# Patient Record
Sex: Female | Born: 1990 | Race: Black or African American | Hispanic: No | Marital: Single | Smoking: Former smoker
Health system: Southern US, Community
[De-identification: ages and names within clinical notes are randomized; demographics above are authoritative.]

## PROBLEM LIST (undated history)

## (undated) ENCOUNTER — Inpatient Hospital Stay (HOSPITAL_COMMUNITY): Payer: Self-pay

## (undated) DIAGNOSIS — R51 Headache: Secondary | ICD-10-CM

## (undated) DIAGNOSIS — R519 Headache, unspecified: Secondary | ICD-10-CM

## (undated) DIAGNOSIS — N83209 Unspecified ovarian cyst, unspecified side: Secondary | ICD-10-CM

## (undated) DIAGNOSIS — F419 Anxiety disorder, unspecified: Secondary | ICD-10-CM

## (undated) HISTORY — PX: KELOID EXCISION: SHX1856

---
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
## 2009-08-03 ENCOUNTER — Emergency Department: Payer: Self-pay | Admitting: Emergency Medicine

## 2010-04-07 ENCOUNTER — Emergency Department: Payer: Self-pay | Admitting: Emergency Medicine

## 2010-06-02 ENCOUNTER — Emergency Department: Payer: Self-pay | Admitting: Emergency Medicine

## 2010-08-06 ENCOUNTER — Ambulatory Visit (HOSPITAL_COMMUNITY)
Admission: AD | Admit: 2010-08-06 | Discharge: 2010-08-06 | Payer: Self-pay | Source: Home / Self Care | Attending: Obstetrics and Gynecology | Admitting: Obstetrics and Gynecology

## 2010-08-19 ENCOUNTER — Observation Stay: Payer: Self-pay | Admitting: Obstetrics & Gynecology

## 2010-08-20 ENCOUNTER — Inpatient Hospital Stay (HOSPITAL_COMMUNITY)
Admission: AD | Admit: 2010-08-20 | Discharge: 2010-08-20 | Disposition: A | Discharge status: Home / Self Care | Payer: Commercial Indemnity | Source: Ambulatory Visit | Attending: Obstetrics | Admitting: Obstetrics

## 2010-08-20 DIAGNOSIS — O99891 Other specified diseases and conditions complicating pregnancy: Secondary | ICD-10-CM | POA: Insufficient documentation

## 2010-08-20 DIAGNOSIS — Y92009 Unspecified place in unspecified non-institutional (private) residence as the place of occurrence of the external cause: Secondary | ICD-10-CM | POA: Insufficient documentation

## 2010-08-20 DIAGNOSIS — W010XXA Fall on same level from slipping, tripping and stumbling without subsequent striking against object, initial encounter: Secondary | ICD-10-CM | POA: Insufficient documentation

## 2010-11-07 ENCOUNTER — Observation Stay: Payer: Self-pay

## 2010-11-10 ENCOUNTER — Inpatient Hospital Stay (HOSPITAL_COMMUNITY)
Admission: AD | Admit: 2010-11-10 | Discharge: 2010-11-14 | DRG: 774 | Disposition: A | Discharge status: Home / Self Care | Payer: Managed Care, Other (non HMO) | Source: Ambulatory Visit | Attending: Obstetrics and Gynecology | Admitting: Obstetrics and Gynecology

## 2010-11-10 DIAGNOSIS — O99892 Other specified diseases and conditions complicating childbirth: Secondary | ICD-10-CM | POA: Diagnosis present

## 2010-11-10 DIAGNOSIS — O4100X0 Oligohydramnios, unspecified trimester, not applicable or unspecified: Principal | ICD-10-CM | POA: Diagnosis present

## 2010-11-10 DIAGNOSIS — Z22330 Carrier of Group B streptococcus: Secondary | ICD-10-CM

## 2010-11-10 DIAGNOSIS — O480 Post-term pregnancy: Secondary | ICD-10-CM | POA: Diagnosis present

## 2010-11-10 LAB — CBC
MCH: 27.1 pg (ref 26.0–34.0)
MCHC: 31.9 g/dL (ref 30.0–36.0)
Platelets: 195 10*3/uL (ref 150–400)
RDW: 15.2 % (ref 11.5–15.5)
WBC: 12.1 10*3/uL — ABNORMAL HIGH (ref 4.0–10.5)

## 2010-11-12 ENCOUNTER — Other Ambulatory Visit: Payer: Self-pay | Admitting: Obstetrics and Gynecology

## 2010-11-13 LAB — CBC
HCT: 31.4 % — ABNORMAL LOW (ref 36.0–46.0)
MCV: 86 fL (ref 78.0–100.0)
RBC: 3.65 MIL/uL — ABNORMAL LOW (ref 3.87–5.11)

## 2010-11-14 LAB — RH IMMUNE GLOB WKUP(>/=20WKS)(NOT WOMEN'S HOSP): Fetal Screen: NEGATIVE

## 2010-11-24 NOTE — Op Note (Signed)
[facility]  Patient:    Molly Bowers, Molly Bowers Visit Number: [id number] MRN: [id number]          Service Type: DSU Location: [facility] Attending Physician:  Carlean Purl Dictated by:   Kristine Garbe Ezzard Standing, M.D. Proc. Date: 10/21/01 Admit Date:  10/21/2001   CC:         Teena Irani. Donnie Coffin, M.D.   Operative Report  PREOPERATIVE DIAGNOSIS:  Large right ear preauricular keloid.  POSTOPERATIVE DIAGNOSIS:  Large right ear preauricular keloid.  OPERATION PERFORMED:  Excision of right ear keloid with local advancement flaps.  SURGEON:  Kristine Garbe. Ezzard Standing, M.D.  ANESTHESIA:  General endotracheal.  COMPLICATIONS:  None.  INDICATIONS FOR PROCEDURE:  The patient is an 20 year old black female who has had a recurrent right preauricular keloid.  She has had two previous surgeries.  She has redeveloped a keloid and presently has a large 6 to 7 cm right preauricular keloid that extends from the lobule up over the tragus up onto the upper portion of the right pinna that actually involves some of the cartilage of the right superior pinna.  This is a little bit ulcerated superiorly.  Because of the massive recurrence of the keloid, the patient is taken to the operating room at this time for local excision followed by postoperative radiation therapy to help decrease recurrence of the keloid.  DESCRIPTION OF PROCEDURE:  After adequate endotracheal anesthesia, the patient received 1 gm Ancef IV preoperatively.  The keloid area was injected with Xylocaine with epinephrine for hemostasis.  The keloid was then excised and involved not only the tragus but up on to the cartilage of the superior pinna. The keloid was sent to pathology.  Because of the size of the keloid, the skin over the carotid fascia had to be undermined as was the skin to the temporal area and a little bit behind the ear behind the pinna superiorly.  After adequate undermining,  hemostasis was obtained with bipolar cautery and cotton pledgets soaked in Adrenalin.  Skin defect was then closed by advancing flaps over the cartilage as well as the cheek skin.  Closed with 4-0 Vicryl sutures subcutaneously and a 6-0 nylon on the skin.  Bacitracin ointment was applied, The patient was subsequently awakened from anesthesia and transferred to recovery room postoperatively doing well.  The patient was scheduled to receive radiation therapy later this morning.  Will have her follow up in my office in one week to have the sutures removed.  She is given Tylenol #3 p.r.n. pain, Keflex 500 mg b.i.d. for one week. Dictated by:   Kristine Garbe Ezzard Standing, M.D. Attending Physician:  Carlean Purl DD:  10/21/01 TD:  10/21/01 Job: [id number]

## 2011-04-26 LAB — POCT URINALYSIS DIP (DEVICE)
Glucose, UA: NEGATIVE
Ketones, ur: NEGATIVE
Protein, ur: >=300 — AB
Specific Gravity, Urine: 1.025

## 2011-09-24 DIAGNOSIS — Z4889 Encounter for other specified surgical aftercare: Secondary | ICD-10-CM | POA: Insufficient documentation

## 2011-11-11 ENCOUNTER — Emergency Department: Payer: Self-pay | Admitting: Unknown Physician Specialty

## 2011-11-11 LAB — URINALYSIS, COMPLETE
Nitrite: POSITIVE
Specific Gravity: 1.023 (ref 1.003–1.030)

## 2011-12-02 ENCOUNTER — Emergency Department: Payer: Self-pay | Admitting: Emergency Medicine

## 2013-03-23 ENCOUNTER — Emergency Department (HOSPITAL_COMMUNITY): Payer: Medicaid Other

## 2013-03-23 ENCOUNTER — Encounter (HOSPITAL_COMMUNITY): Payer: Self-pay | Admitting: Emergency Medicine

## 2013-03-23 ENCOUNTER — Emergency Department (HOSPITAL_COMMUNITY)
Admission: EM | Admit: 2013-03-23 | Discharge: 2013-03-23 | Disposition: A | Discharge status: Home / Self Care | Payer: Medicaid Other | Attending: Emergency Medicine | Admitting: Emergency Medicine

## 2013-03-23 DIAGNOSIS — Z880 Allergy status to penicillin: Secondary | ICD-10-CM | POA: Insufficient documentation

## 2013-03-23 DIAGNOSIS — Z79899 Other long term (current) drug therapy: Secondary | ICD-10-CM | POA: Insufficient documentation

## 2013-03-23 DIAGNOSIS — M79672 Pain in left foot: Secondary | ICD-10-CM

## 2013-03-23 DIAGNOSIS — Y929 Unspecified place or not applicable: Secondary | ICD-10-CM | POA: Insufficient documentation

## 2013-03-23 DIAGNOSIS — S8990XA Unspecified injury of unspecified lower leg, initial encounter: Secondary | ICD-10-CM | POA: Insufficient documentation

## 2013-03-23 DIAGNOSIS — Y939 Activity, unspecified: Secondary | ICD-10-CM | POA: Insufficient documentation

## 2013-03-23 DIAGNOSIS — W010XXA Fall on same level from slipping, tripping and stumbling without subsequent striking against object, initial encounter: Secondary | ICD-10-CM | POA: Insufficient documentation

## 2013-03-23 MED ORDER — TRAMADOL HCL 50 MG PO TABS: 50.0000 mg | ORAL_TABLET | Freq: Four times a day (QID) | ORAL | Status: DC | PRN

## 2013-03-23 MED ORDER — NAPROXEN 500 MG PO TABS: 500.0000 mg | ORAL_TABLET | Freq: Two times a day (BID) | ORAL | Status: DC

## 2013-03-23 NOTE — ED Notes (Signed)
Pt states she turned left foot this am while at work. Pain left dorsal foot.

## 2013-03-23 NOTE — ED Provider Notes (Signed)
CSN: [id number]     Arrival date & time 03/23/13  1052 History  This chart was scribed for Molly Bleacher, PA, working with Celene Kras, MD by Blanchard Kelch, ED Scribe. This patient was seen in room TR06C/TR06C and the patient's care was started at 11:56 AM.    Chief Complaint  Patient presents with  . Foot Injury    The history is provided by the patient. No language interpreter was used.    HPI Comments: Molly Bowers is a 22 y.o. female who presents to the Emergency Department complaining of a left foot injury that occurred when the patient fell this morning about five hours ago. She slipped on a puddle of water and landed on her left foot. She has constant, sudden onset pain to the area immediately after the fall. She denies taking any medication for the pain. She denies being able to put weight on her left foot.   History reviewed. No pertinent past medical history. History reviewed. No pertinent past surgical history. No family history on file. History  Substance Use Topics  . Smoking status: Never Smoker   . Smokeless tobacco: Not on file  . Alcohol Use: Yes   OB History   Grav Para Term Preterm Abortions TAB SAB Ect Mult Living                 Review of Systems  Constitutional: Negative for activity change.  HENT: Negative for neck pain.   Musculoskeletal: Positive for arthralgias. Negative for myalgias, back pain and joint swelling.  Skin: Negative for wound.  Neurological: Negative for weakness and numbness.    Allergies  Vancomycin and Penicillins  Home Medications   Current Outpatient Rx  Name  Route  Sig  Dispense  Refill  . ibuprofen (ADVIL,MOTRIN) 200 MG tablet   Oral   Take 400 mg by mouth every 6 (six) hours as needed for pain.         Marland Kitchen levonorgestrel (MIRENA) 20 MCG/24HR IUD   Intrauterine   1 each by Intrauterine route once.          Triage Vitals: BP 106/65  Pulse 87  Temp(Src) 98 F (36.7 C) (Oral)  Resp 18  Ht 5' 5.5" (1.664 m)  Wt  228 lb (103.42 kg)  BMI 37.35 kg/m2  SpO2 100%  Physical Exam  Nursing note and vitals reviewed. Constitutional: She appears well-developed and well-nourished.  HENT:  Head: Normocephalic and atraumatic.  Eyes: Conjunctivae are normal.  Neck: Normal range of motion. Neck supple.  Cardiovascular:  Pulses:      Dorsalis pedis pulses are 2+ on the right side, and 2+ on the left side.       Posterior tibial pulses are 2+ on the right side, and 2+ on the left side.  Musculoskeletal: She exhibits edema and tenderness.       Left knee: Normal.       Left ankle: Tenderness. Lateral malleolus tenderness found. No medial malleolus, no head of 5th metatarsal and no proximal fibula tenderness found.       Left lower leg: Normal.       Left foot: Normal.       Feet:  Patient complains of pain with palpation of the lateral left ankle. She denies pain with palpation over the fibular head of the affected side. She denies pain in the hip of the affected side.  Neurological: She is alert.  Distal motor, sensation, and vascular intact.   Skin: Skin  is warm and dry.  Psychiatric: She has a normal mood and affect.    ED Course  Procedures (including critical care time)  DIAGNOSTIC STUDIES: Oxygen Saturation is 100% on room air, normal by my interpretation.    COORDINATION OF CARE:  11:56 AM - Patient verbalizes understanding and agrees with treatment plan.    Labs Review Labs Reviewed - No data to display Imaging Review Dg Foot Complete Left  03/23/2013   *RADIOLOGY REPORT*  Clinical Data: Slipped, bent foot, lateral pain.  LEFT FOOT - COMPLETE 3+ VIEW  Comparison: None.  Findings: Negative for fracture, dislocation, or other acute abnormality.  Normal alignment and mineralization. No significant degenerative change.  Regional soft tissues unremarkable.  IMPRESSION:  Negative   Original Report Authenticated By: D. Andria Rhein, MD   Patient seen and examined. Work-up initiated. Medications  ordered.   Vital signs reviewed and are as follows: Filed Vitals:   03/23/13 1107  BP: 106/65  Pulse: 87  Temp: 98 F (36.7 C)  Resp: 18   X-ray reviewed by myself. Patient informed of results.   Patient counseled on use of narcotic pain medications. Counseled not to combine these medications with others containing tylenol. Urged not to drink alcohol, drive, or perform any other activities that requires focus while taking these medications. The patient verbalizes understanding and agrees with the plan.  Patient was counseled on RICE protocol and told to rest injury, use ice for no longer than 15 minutes every hour, compress the area, and elevate above the level of their heart as much as possible to reduce swelling.  Questions answered.  Patient verbalized understanding.       MDM   1. Foot pain, left    Patient with foot/ankle pain. Negative x-ray. Rice and conservative management indicated with orthopedic followup if not improved. Patient provided with crutches and ASO.  I personally performed the services described in this documentation, which was scribed in my presence. The recorded information has been reviewed and is accurate.    Renne Crigler, PA-C 03/23/13 1300

## 2013-03-25 NOTE — ED Provider Notes (Signed)
Medical screening examination/treatment/procedure(s) were performed by non-physician practitioner and as supervising physician I was immediately available for consultation/collaboration.    Christabella Alvira R Zaniel Marineau, MD 03/25/13 1635 

## 2013-05-04 ENCOUNTER — Encounter (HOSPITAL_COMMUNITY): Payer: Self-pay | Admitting: Emergency Medicine

## 2013-05-04 DIAGNOSIS — F17200 Nicotine dependence, unspecified, uncomplicated: Secondary | ICD-10-CM | POA: Insufficient documentation

## 2013-05-04 DIAGNOSIS — T8389XA Other specified complication of genitourinary prosthetic devices, implants and grafts, initial encounter: Secondary | ICD-10-CM | POA: Insufficient documentation

## 2013-05-04 DIAGNOSIS — Z880 Allergy status to penicillin: Secondary | ICD-10-CM | POA: Insufficient documentation

## 2013-05-04 DIAGNOSIS — Y838 Other surgical procedures as the cause of abnormal reaction of the patient, or of later complication, without mention of misadventure at the time of the procedure: Secondary | ICD-10-CM | POA: Insufficient documentation

## 2013-05-04 DIAGNOSIS — Z881 Allergy status to other antibiotic agents status: Secondary | ICD-10-CM | POA: Insufficient documentation

## 2013-05-04 DIAGNOSIS — Z30431 Encounter for routine checking of intrauterine contraceptive device: Secondary | ICD-10-CM | POA: Insufficient documentation

## 2013-05-04 DIAGNOSIS — Z3202 Encounter for pregnancy test, result negative: Secondary | ICD-10-CM | POA: Insufficient documentation

## 2013-05-04 NOTE — ED Notes (Signed)
C/o vaginal bleeding, "reports ~ 20 minutes ago, noticed strings of merina were hanging out of vagina, googled what to do, read to come to ED, some vaginal discomfort, cervix feels swollen, and noticed some blood. Rates pain 7/10.

## 2013-05-05 ENCOUNTER — Emergency Department (HOSPITAL_COMMUNITY)
Admission: EM | Admit: 2013-05-05 | Discharge: 2013-05-05 | Disposition: A | Discharge status: Home / Self Care | Payer: Managed Care, Other (non HMO) | Attending: Emergency Medicine | Admitting: Emergency Medicine

## 2013-05-05 DIAGNOSIS — T8389XA Other specified complication of genitourinary prosthetic devices, implants and grafts, initial encounter: Secondary | ICD-10-CM

## 2013-05-05 NOTE — ED Provider Notes (Signed)
CSN: [id number]     Arrival date & time 05/04/13  1949 History   First MD Initiated Contact with Patient 05/05/13 0029     Chief Complaint  Patient presents with  . Vaginal Bleeding   (Consider location/radiation/quality/duration/timing/severity/associated sxs/prior Treatment) HPI 22 year old female presents emergency department with complaint of vaginal bleeding, and possible problem with her IUD.  She has had IUD in place for the last 2 years.  Tonight, noted onset of vaginal bleeding, and while wiping noted that the strings were lower than normal.  She reports that her cervix feels more swollen to her than normal.  IUD was placed by health department.  No pain or cramping.  No fevers or chills.  Last menstrual period about 2 months ago, normal since starting her IUD  History reviewed. No pertinent past medical history. History reviewed. No pertinent past surgical history. No family history on file. History  Substance Use Topics  . Smoking status: Current Every Day Smoker  . Smokeless tobacco: Not on file  . Alcohol Use: Yes   OB History   Grav Para Term Preterm Abortions TAB SAB Ect Mult Living                 Review of Systems  See History of Present Illness; otherwise all other systems are reviewed and negative Allergies  Peanut-containing drug products; Vancomycin; and Penicillins  Home Medications   Current Outpatient Rx  Name  Route  Sig  Dispense  Refill  . levonorgestrel (MIRENA) 20 MCG/24HR IUD   Intrauterine   1 each by Intrauterine route once.          BP 115/79  Pulse 72  Temp(Src) 97.9 F (36.6 C) (Oral)  Resp 18  Ht 5\' 6"  (1.676 m)  Wt 217 lb 1 oz (98.459 kg)  BMI 35.05 kg/m2  SpO2 99%  LMP 04/13/2013 Physical Exam  Genitourinary:  External genitalia normal Vagina with trace blood  Cervix slightly open.  Unable to visualize IUD itself.  Strings do appear to be longer than expected No cervical motion tenderness Adnexa palpated, no masses or  tenderness noted Bladder palpated no tenderness Uterus palpated no masses or tenderness      ED Course  Procedures (including critical care time) Labs Review Labs Reviewed  POCT PREGNANCY, URINE   Imaging Review No results found.  EKG Interpretation   None       MDM   1. IUD complication, initial encounter    22 year old female with change in her IUD string length.  Her os is slightly open, but IUD appears to still be in place.  I have instructed her to follow up with the health department.  She's been advised to use backup method.    Olivia Mackie, MD 05/05/13 [phone]

## 2013-05-05 NOTE — ED Notes (Signed)
Pelvic cart at bedside. 

## 2014-02-22 ENCOUNTER — Encounter (HOSPITAL_COMMUNITY): Payer: Self-pay | Admitting: Emergency Medicine

## 2014-02-22 ENCOUNTER — Emergency Department (HOSPITAL_COMMUNITY)
Admission: EM | Admit: 2014-02-22 | Discharge: 2014-02-22 | Disposition: A | Discharge status: Home / Self Care | Payer: Managed Care, Other (non HMO) | Attending: Emergency Medicine | Admitting: Emergency Medicine

## 2014-02-22 ENCOUNTER — Emergency Department (HOSPITAL_COMMUNITY): Payer: Managed Care, Other (non HMO)

## 2014-02-22 DIAGNOSIS — X500XXA Overexertion from strenuous movement or load, initial encounter: Secondary | ICD-10-CM | POA: Diagnosis not present

## 2014-02-22 DIAGNOSIS — W010XXA Fall on same level from slipping, tripping and stumbling without subsequent striking against object, initial encounter: Secondary | ICD-10-CM | POA: Diagnosis not present

## 2014-02-22 DIAGNOSIS — Y93E5 Activity, floor mopping and cleaning: Secondary | ICD-10-CM | POA: Diagnosis not present

## 2014-02-22 DIAGNOSIS — S8990XA Unspecified injury of unspecified lower leg, initial encounter: Secondary | ICD-10-CM | POA: Diagnosis present

## 2014-02-22 DIAGNOSIS — Z880 Allergy status to penicillin: Secondary | ICD-10-CM | POA: Diagnosis not present

## 2014-02-22 DIAGNOSIS — S93609A Unspecified sprain of unspecified foot, initial encounter: Secondary | ICD-10-CM | POA: Diagnosis not present

## 2014-02-22 DIAGNOSIS — S93601A Unspecified sprain of right foot, initial encounter: Secondary | ICD-10-CM

## 2014-02-22 DIAGNOSIS — F17200 Nicotine dependence, unspecified, uncomplicated: Secondary | ICD-10-CM | POA: Insufficient documentation

## 2014-02-22 DIAGNOSIS — Y9289 Other specified places as the place of occurrence of the external cause: Secondary | ICD-10-CM | POA: Insufficient documentation

## 2014-02-22 DIAGNOSIS — S99929A Unspecified injury of unspecified foot, initial encounter: Secondary | ICD-10-CM

## 2014-02-22 DIAGNOSIS — S99919A Unspecified injury of unspecified ankle, initial encounter: Secondary | ICD-10-CM

## 2014-02-22 MED ORDER — NAPROXEN 375 MG PO TABS: 375.0000 mg | ORAL_TABLET | Freq: Two times a day (BID) | ORAL | Status: DC

## 2014-02-22 NOTE — ED Notes (Signed)
Pt c/o pain to right foot since yesterday, sts she was mopping when she she slipped and twisted her foot inwards. Pt c/o pain to arch of foot when she puts pressure on it. Nad, skin warm and dry, resp e/u.

## 2014-02-22 NOTE — ED Provider Notes (Signed)
  Medical screening examination/treatment/procedure(s) were performed by non-physician practitioner and as supervising physician I was immediately available for consultation/collaboration.     Gerhard Munchobert Elton Catalano, MD 02/22/14 [phone]

## 2014-02-22 NOTE — ED Provider Notes (Signed)
CSN: [id number]     Arrival date & time 02/22/14  [id number] History   First MD Initiated Contact with Patient 02/22/14 [phone]     Chief Complaint  Patient presents with  . Foot Pain     (Consider location/radiation/quality/duration/timing/severity/associated sxs/prior Treatment) HPI  Patient to the ER with complaints of right foot pain since yesterday. She reportedly was mopping the floor when she accidentally slipped, falling and causing her foot to invert. Since then she has had significant pain when walking or standing on the foot. The pain relieves when she rests. She has not iced it and has not tried anything for pain. She denies loc, head or neck injury. Denies having any other complaints.  History reviewed. No pertinent past medical history. History reviewed. No pertinent past surgical history. No family history on file. History  Substance Use Topics  . Smoking status: Current Some Day Smoker    Types: Cigarettes  . Smokeless tobacco: Not on file  . Alcohol Use: Yes   OB History   Grav Para Term Preterm Abortions TAB SAB Ect Mult Living                 Review of Systems  Review of Systems  Gen: no weight loss, fevers, chills, night sweats  Eyes: no occular draining, occular pain,  No visual changes  Nose: no epistaxis or rhinorrhea  Mouth: no dental pain, no sore throat  Neck: no neck pain  Lungs: No hemoptysis. No wheezing or coughing CV:  No palpitations, dependent edema or orthopnea. No chest pain Abd: no diarrhea. No nausea or vomiting, No abdominal pain  GU: no dysuria or gross hematuria  MSK:  No muscle weakness,  + right foot pain Neuro: no headache, no focal neurologic deficits  Skin: no rash , no wounds Psyche: no complaints of depression or anxiety     Allergies  Peanut-containing drug products; Vancomycin; and Penicillins  Home Medications   Prior to Admission medications   Medication Sig Start Date End Date Taking? Authorizing Provider  PARAGARD  INTRAUTERINE COPPER IUD IUD 1 each by Intrauterine route once.   Yes Historical Provider, MD   BP 110/62  Pulse 72  Temp(Src) 99 F (37.2 C) (Oral)  Resp 18  Ht 5\' 5"  (1.651 m)  Wt 215 lb (97.523 kg)  BMI 35.78 kg/m2  SpO2 97%  LMP 02/02/2014 Physical Exam  Nursing note and vitals reviewed. Constitutional: She appears well-developed and well-nourished. No distress.  HENT:  Head: Normocephalic and atraumatic.  Eyes: Pupils are equal, round, and reactive to light.  Neck: Normal range of motion. Neck supple.  Cardiovascular: Normal rate and regular rhythm.   Pulmonary/Chest: Effort normal.  Abdominal: Soft.  Musculoskeletal:       Right foot: She exhibits tenderness and bony tenderness. She exhibits normal range of motion, no swelling, normal capillary refill, no crepitus, no deformity and no laceration.  Pt with tenderness to the mid lateral dorsal portion of her foot. No ecchymosis, masses, swelling, cellulitis or skin disruption.   Neurological: She is alert.  Skin: Skin is warm and dry.    ED Course  Procedures (including critical care time) Labs Review Labs Reviewed - No data to display  Imaging Review Dg Foot Complete Right  02/22/2014   CLINICAL DATA:  Foot pain secondary to a twisting injury.  EXAM: RIGHT FOOT COMPLETE - 3+ VIEW  COMPARISON:  None.  FINDINGS: There is no evidence of fracture or dislocation. There is no evidence of arthropathy  or other focal bone abnormality. Soft tissues are unremarkable.  IMPRESSION: Normal exam.   Electronically Signed   By: Geanie Cooley M.D.   On: 02/22/2014 09:09     EKG Interpretation None      MDM   Final diagnoses:  Foot sprain, right, initial encounter    Patients xray shows no boney abnormality. No changes to skin on xray. Likely sprain to midfoot. Will treat with bost op wrap, crutches and referral to Ortho.  Rx: Naprosyn  . 23 y.o.Molly Bowers's evaluation in the Emergency Department is complete. It has been  determined that no acute conditions requiring further emergency intervention are present at this time. The patient/guardian have been advised of the diagnosis and plan. We have discussed signs and symptoms that warrant return to the ED, such as changes or worsening in symptoms.  Vital signs are stable at discharge. Filed Vitals:   02/22/14 0830  BP: 110/62  Pulse: 72  Temp:   Resp:     Patient/guardian has voiced understanding and agreed to follow-up with the PCP or specialist.    Dorthula Matas, PA-C 02/22/14 [phone]

## 2014-09-19 ENCOUNTER — Emergency Department (HOSPITAL_COMMUNITY)
Admission: EM | Admit: 2014-09-19 | Discharge: 2014-09-20 | Disposition: A | Discharge status: Home / Self Care | Payer: Managed Care, Other (non HMO) | Attending: Emergency Medicine | Admitting: Emergency Medicine

## 2014-09-19 ENCOUNTER — Encounter (HOSPITAL_COMMUNITY): Payer: Self-pay | Admitting: *Deleted

## 2014-09-19 DIAGNOSIS — Z791 Long term (current) use of non-steroidal anti-inflammatories (NSAID): Secondary | ICD-10-CM | POA: Insufficient documentation

## 2014-09-19 DIAGNOSIS — Z720 Tobacco use: Secondary | ICD-10-CM | POA: Insufficient documentation

## 2014-09-19 DIAGNOSIS — J029 Acute pharyngitis, unspecified: Secondary | ICD-10-CM

## 2014-09-19 DIAGNOSIS — Z880 Allergy status to penicillin: Secondary | ICD-10-CM | POA: Insufficient documentation

## 2014-09-19 LAB — RAPID STREP SCREEN (MED CTR MEBANE ONLY): STREPTOCOCCUS, GROUP A SCREEN (DIRECT): NEGATIVE

## 2014-09-19 MED ORDER — ACETAMINOPHEN 325 MG PO TABS
650.0000 mg | ORAL_TABLET | Freq: Once | ORAL | Status: AC
  Administered 2014-09-19: 650 mg via ORAL
  Filled 2014-09-19: qty 2

## 2014-09-19 NOTE — ED Notes (Signed)
The pt is c/o a headache sore throat for 1-2 weeks no nv  lmp last month

## 2014-09-20 MED ORDER — NAPROXEN 250 MG PO TABS
500.0000 mg | ORAL_TABLET | Freq: Once | ORAL | Status: AC
  Administered 2014-09-20: 500 mg via ORAL
  Filled 2014-09-20: qty 2

## 2014-09-20 MED ORDER — NAPROXEN 500 MG PO TABS: 500.0000 mg | ORAL_TABLET | Freq: Two times a day (BID) | ORAL | Status: DC

## 2014-09-20 MED ORDER — CEPHALEXIN 500 MG PO CAPS: 500.0000 mg | ORAL_CAPSULE | Freq: Two times a day (BID) | ORAL | Status: DC

## 2014-09-20 MED ORDER — LIDOCAINE VISCOUS 2 % MT SOLN
15.0000 mL | Freq: Once | OROMUCOSAL | Status: AC
  Administered 2014-09-20: 15 mL via OROMUCOSAL
  Filled 2014-09-20: qty 15

## 2014-09-20 NOTE — ED Provider Notes (Signed)
CSN: [id number]     Arrival date & time 09/19/14  2132 History   First MD Initiated Contact with Patient 09/19/14 2354     Chief Complaint  Patient presents with  . Headache   (Consider location/radiation/quality/duration/timing/severity/associated sxs/prior Treatment) HPI  Molly Bowers is a 24 yo female presenting with report of sore throat x 3 days.  She reports she has also had an intermittent headache x 2 weeks but that has since resolved.  Her throat began hurting 3 days ago and she reports feeling chills at times.  She reports her sore throat is worse when swallowing and she became more concerned when she saw spots on her throat. She is able to drink fluids despite the pain, which she rates as 8/10.  She denies cough, nausea, vomiting or abd pain.   History reviewed. No pertinent past medical history. History reviewed. No pertinent past surgical history. No family history on file. History  Substance Use Topics  . Smoking status: Current Some Day Smoker    Types: Cigarettes  . Smokeless tobacco: Not on file  . Alcohol Use: Yes   OB History    No data available     Review of Systems  Constitutional: Negative for fever and chills.  HENT: Positive for sore throat.   Respiratory: Negative for cough.   Gastrointestinal: Negative for nausea and vomiting.  Musculoskeletal: Negative for myalgias.  Skin: Negative for rash.  Neurological: Positive for headaches. Negative for weakness and numbness.      Allergies  Peanut-containing drug products; Vancomycin; and Penicillins  Home Medications   Prior to Admission medications   Medication Sig Start Date End Date Taking? Authorizing Provider  naproxen (NAPROSYN) 375 MG tablet Take 1 tablet (375 mg total) by mouth 2 (two) times daily. 02/22/14   Marlon Pel, PA-C  PARAGARD INTRAUTERINE COPPER IUD IUD 1 each by Intrauterine route once.    Historical Provider, MD   BP 131/69 mmHg  Pulse 92  Temp(Src) 99 F (37.2 C) (Oral)   Resp 22  SpO2 99%  LMP 09/19/2014 Physical Exam  Constitutional: She is oriented to person, place, and time. She appears well-developed and well-nourished. No distress.  HENT:  Head: Normocephalic and atraumatic.  Mouth/Throat: Oropharyngeal exudate, posterior oropharyngeal edema and posterior oropharyngeal erythema present. No tonsillar abscesses.  Eyes: Conjunctivae are normal.  Neck: Neck supple. No thyromegaly present.  Cardiovascular: Normal rate, regular rhythm and intact distal pulses.   Pulmonary/Chest: Effort normal and breath sounds normal. No respiratory distress. She has no wheezes. She has no rales. She exhibits no tenderness.  Abdominal: Soft. There is no tenderness.  Musculoskeletal: She exhibits no tenderness.  Lymphadenopathy:       Head (left side): Tonsillar adenopathy present.    She has no cervical adenopathy.  Neurological: She is alert and oriented to person, place, and time. She has normal strength. No cranial nerve deficit or sensory deficit. Coordination normal. GCS eye subscore is 4. GCS verbal subscore is 5. GCS motor subscore is 6.  Skin: Skin is warm and dry. No rash noted. She is not diaphoretic.  Psychiatric: She has a normal mood and affect.  Nursing note and vitals reviewed.   ED Course  Procedures (including critical care time) Labs Review Labs Reviewed  RAPID STREP SCREEN  CULTURE, GROUP A STREP   Imaging Review No results found.   EKG Interpretation None      MDM   Final diagnoses:  Pharyngitis   24 yo with sore throat,  tonsillar exudate, and cervical lymphadenopathy. Despite her negative strep screen, her clinical appearance is consistent with strep pharyngitis. She was treated in the ED with tylenol and NSAIDs.  Pt is allergic to penicillin so prescription for Clindamycin provided. Encouraged to follow-up with PCP later this week for re-check. Pt tolerating POs in the ED.   Presentation non concerning for PTA or infxn spread to soft  tissue. No trismus or uvula deviation. Specific return precautions discussed. Pt aware of plan and in agreement.   Filed Vitals:   09/19/14 2144 09/20/14 0015  BP: 131/69 103/53  Pulse: 92 66  Temp: 99 F (37.2 C) 98.2 F (36.8 C)  TempSrc: Oral Oral  Resp: 22 20  SpO2: 99% 100%   Meds given in ED:  Medications  acetaminophen (TYLENOL) tablet 650 mg (650 mg Oral Given 09/19/14 2335)  naproxen (NAPROSYN) tablet 500 mg (500 mg Oral Given 09/20/14 0048)  lidocaine (XYLOCAINE) 2 % viscous mouth solution 15 mL (15 mLs Mouth/Throat Given 09/20/14 0050)    Discharge Medication List as of 09/20/2014 12:11 AM    START taking these medications   Details  cephALEXin (KEFLEX) 500 MG capsule Take 1 capsule (500 mg total) by mouth 2 (two) times daily., Starting 09/20/2014, Until Discontinued, Print    !! naproxen (NAPROSYN) 500 MG tablet Take 1 tablet (500 mg total) by mouth 2 (two) times daily., Starting 09/20/2014, Until Discontinued, Print     !! - Potential duplicate medications found. Please discuss with provider.         Harle BattiestElizabeth Lyah Millirons, NP 09/22/14 0025  Lorre NickAnthony Allen, MD 09/22/14 [phone]

## 2014-09-20 NOTE — Discharge Instructions (Signed)
Please follow the directions provided.  Be sure to follow-up with your primary care provider to ensure you are getting better.  Take the naproxen twice a day to help with inflammation and pain.  Take the antibiotic as directed until it is all gone. Don't hesitate to return for any new, worsening or concerning symptoms.    SEEK IMMEDIATE MEDICAL CARE IF:  You develop any new symptoms such as vomiting, severe headache, stiff or painful neck, chest pain, shortness of breath, or trouble swallowing.  You develop severe throat pain, drooling, or changes in your voice.  You develop swelling of the neck, or the skin on the neck becomes red and tender.  You develop signs of dehydration, such as fatigue, dry mouth, and decreased urination.  You become increasingly sleepy, or you cannot wake up completely.

## 2014-09-24 LAB — CULTURE, GROUP A STREP

## 2015-02-03 ENCOUNTER — Encounter (HOSPITAL_COMMUNITY): Payer: Self-pay | Admitting: Vascular Surgery

## 2015-02-03 ENCOUNTER — Emergency Department (HOSPITAL_COMMUNITY)
Admission: EM | Admit: 2015-02-03 | Discharge: 2015-02-03 | Disposition: A | Discharge status: Home / Self Care | Payer: Managed Care, Other (non HMO) | Attending: Emergency Medicine | Admitting: Emergency Medicine

## 2015-02-03 ENCOUNTER — Emergency Department (HOSPITAL_COMMUNITY): Payer: Managed Care, Other (non HMO)

## 2015-02-03 DIAGNOSIS — M549 Dorsalgia, unspecified: Secondary | ICD-10-CM | POA: Insufficient documentation

## 2015-02-03 DIAGNOSIS — Z880 Allergy status to penicillin: Secondary | ICD-10-CM | POA: Insufficient documentation

## 2015-02-03 DIAGNOSIS — R1031 Right lower quadrant pain: Secondary | ICD-10-CM | POA: Insufficient documentation

## 2015-02-03 DIAGNOSIS — G43909 Migraine, unspecified, not intractable, without status migrainosus: Secondary | ICD-10-CM | POA: Insufficient documentation

## 2015-02-03 DIAGNOSIS — Z720 Tobacco use: Secondary | ICD-10-CM | POA: Insufficient documentation

## 2015-02-03 LAB — I-STAT BETA HCG BLOOD, ED (MC, WL, AP ONLY): I-stat hCG, quantitative: <5 m[IU]/mL (ref <5)

## 2015-02-03 LAB — URINALYSIS, ROUTINE W REFLEX MICROSCOPIC
Bilirubin Urine: NEGATIVE
Glucose, UA: NEGATIVE mg/dL
HGB URINE DIPSTICK: NEGATIVE
Ketones, ur: NEGATIVE mg/dL
Leukocytes, UA: NEGATIVE
Nitrite: NEGATIVE
PROTEIN: NEGATIVE mg/dL
Specific Gravity, Urine: 1.021 (ref 1.005–1.030)
Urobilinogen, UA: 1 mg/dL (ref 0.0–1.0)
pH: 6.5 (ref 5.0–8.0)

## 2015-02-03 LAB — WET PREP, GENITAL
Trich, Wet Prep: NONE SEEN
YEAST WET PREP: NONE SEEN

## 2015-02-03 LAB — COMPREHENSIVE METABOLIC PANEL
ALT: 12 U/L — ABNORMAL LOW (ref 14–54)
AST: 16 U/L (ref 15–41)
Albumin: 3.7 g/dL (ref 3.5–5.0)
Alkaline Phosphatase: 54 U/L (ref 38–126)
Anion gap: 6 (ref 5–15)
BUN: 7 mg/dL (ref 6–20)
CO2: 26 mmol/L (ref 22–32)
CREATININE: 0.69 mg/dL (ref 0.44–1.00)
Calcium: 9 mg/dL (ref 8.9–10.3)
Chloride: 108 mmol/L (ref 101–111)
GFR calc Af Amer: >60 mL/min (ref >60)
GLUCOSE: 92 mg/dL (ref 65–99)
Potassium: 3.8 mmol/L (ref 3.5–5.1)
Sodium: 140 mmol/L (ref 135–145)
TOTAL PROTEIN: 6.6 g/dL (ref 6.5–8.1)
Total Bilirubin: 0.8 mg/dL (ref 0.3–1.2)

## 2015-02-03 LAB — CBC
HEMATOCRIT: 38.5 % (ref 36.0–46.0)
Hemoglobin: 12.7 g/dL (ref 12.0–15.0)
MCH: 28.8 pg (ref 26.0–34.0)
MCHC: 33 g/dL (ref 30.0–36.0)
MCV: 87.3 fL (ref 78.0–100.0)
Platelets: 244 10*3/uL (ref 150–400)
RBC: 4.41 MIL/uL (ref 3.87–5.11)
RDW: 13 % (ref 11.5–15.5)
WBC: 11.2 10*3/uL — AB (ref 4.0–10.5)

## 2015-02-03 LAB — LIPASE, BLOOD: Lipase: 21 U/L — ABNORMAL LOW (ref 22–51)

## 2015-02-03 MED ORDER — HYDROCODONE-ACETAMINOPHEN 5-325 MG PO TABS: ORAL_TABLET | ORAL | Status: DC

## 2015-02-03 MED ORDER — SODIUM CHLORIDE 0.9 % IV BOLUS (SEPSIS)
1000.0000 mL | Freq: Once | INTRAVENOUS | Status: AC
  Administered 2015-02-03: 1000 mL via INTRAVENOUS

## 2015-02-03 MED ORDER — PROCHLORPERAZINE EDISYLATE 5 MG/ML IJ SOLN
10.0000 mg | Freq: Once | INTRAMUSCULAR | Status: AC
  Administered 2015-02-03: 10 mg via INTRAVENOUS
  Filled 2015-02-03: qty 2

## 2015-02-03 MED ORDER — METHYLPREDNISOLONE SODIUM SUCC 125 MG IJ SOLR
125.0000 mg | Freq: Once | INTRAMUSCULAR | Status: AC
  Administered 2015-02-03: 125 mg via INTRAVENOUS
  Filled 2015-02-03: qty 2

## 2015-02-03 MED ORDER — DIPHENHYDRAMINE HCL 50 MG/ML IJ SOLN
25.0000 mg | Freq: Once | INTRAMUSCULAR | Status: AC
  Administered 2015-02-03: 25 mg via INTRAVENOUS
  Filled 2015-02-03: qty 1

## 2015-02-03 NOTE — ED Notes (Signed)
Pt left with all belongings and wheeled out to ER canopy.  

## 2015-02-03 NOTE — ED Provider Notes (Signed)
CSN: [id number]     Arrival date & time 02/03/15  1749 History   First MD Initiated Contact with Patient 02/03/15 2006     Chief Complaint  Patient presents with  . Migraine  . Abdominal Pain  . Back Pain     (Consider location/radiation/quality/duration/timing/severity/associated sxs/prior Treatment) HPI   Blood pressure 115/72, pulse 85, temperature 98.3 F (36.8 C), temperature source Oral, resp. rate 16, last menstrual period 01/20/2015, SpO2 93 %.  Molly Bowers is a 24 y.o. female complaining of severe, 8 out of 10 right lower quadrant pain worsening over the course of 5 days pain radiates to her back, she's been taking Motrin at home with little relief. She has looser than normal stool but it is not more frequent. She has no vomiting, decreased by mouth intake, fever, chills, dysuria, hematuria, urinary frequency, abnormal vaginal discharge. Patient states that her last menstrual period was 2 weeks ago, states she had her IUD removed 1 week ago. States that she has an exacerbation of her chronic migraine which is typical for her. She's been taking Excedrin for that with little relief.   History reviewed. No pertinent past medical history. History reviewed. No pertinent past surgical history. No family history on file. History  Substance Use Topics  . Smoking status: Current Some Day Smoker    Types: Cigarettes  . Smokeless tobacco: Not on file  . Alcohol Use: Yes   OB History    No data available     Review of Systems  10 systems reviewed and found to be negative, except as noted in the HPI.  Allergies  Peanut-containing drug products; Vancomycin; and Penicillins  Home Medications   Prior to Admission medications   Medication Sig Start Date End Date Taking? Authorizing Provider  aspirin-acetaminophen-caffeine (EXCEDRIN MIGRAINE) [phone] MG per tablet Take 1 tablet by mouth every 6 (six) hours as needed for headache or migraine.   Yes Historical Provider, MD   ibuprofen (ADVIL,MOTRIN) 200 MG tablet Take 200 mg by mouth every 6 (six) hours as needed for headache, mild pain or moderate pain.   Yes Historical Provider, MD  cephALEXin (KEFLEX) 500 MG capsule Take 1 capsule (500 mg total) by mouth 2 (two) times daily. Patient not taking: Reported on 02/03/2015 09/20/14   Harle Battiest, NP  naproxen (NAPROSYN) 375 MG tablet Take 1 tablet (375 mg total) by mouth 2 (two) times daily. Patient not taking: Reported on 02/03/2015 02/22/14   Marlon Pel, PA-C  naproxen (NAPROSYN) 500 MG tablet Take 1 tablet (500 mg total) by mouth 2 (two) times daily. Patient not taking: Reported on 02/03/2015 09/20/14   Harle Battiest, NP   BP 115/72 mmHg  Pulse 85  Temp(Src) 98.3 F (36.8 C) (Oral)  Resp 16  SpO2 93%  LMP 01/20/2015 Physical Exam  Constitutional: She is oriented to person, place, and time. She appears well-developed and well-nourished.  HENT:  Head: Normocephalic and atraumatic.  Mouth/Throat: Oropharynx is clear and moist.  Eyes: Conjunctivae and EOM are normal. Pupils are equal, round, and reactive to light.  No TTP of maxillary or frontal sinuses  No TTP or induration of temporal arteries bilaterally  Neck: Normal range of motion. Neck supple.  FROM to C-spine. Pt can touch chin to chest without discomfort. No TTP of midline cervical spine.   Cardiovascular: Normal rate, regular rhythm and intact distal pulses.   Pulmonary/Chest: Effort normal and breath sounds normal. No respiratory distress. She has no wheezes. She has no rales. She  exhibits no tenderness.  Abdominal: Soft. Bowel sounds are normal. She exhibits no distension and no mass. There is tenderness. There is no rebound and no guarding.  Tender to deep palpation of the right lower quadrant with no guarding or rebound, Rovsing, psoas, obturator are negative.  Genitourinary:  Exam a chaperoned by RN: No rashes or lesions, no abnormal vaginal discharge, cervical or adnexal tenderness  palpation.  Musculoskeletal: Normal range of motion. She exhibits no edema or tenderness.  Neurological: She is alert and oriented to person, place, and time. No cranial nerve deficit.  II-Visual fields grossly intact. III/IV/VI-Extraocular movements intact.  Pupils reactive bilaterally. V/VII-Smile symmetric, equal eyebrow raise,  facial sensation intact VIII- Hearing grossly intact IX/X-Normal gag XI-bilateral shoulder shrug XII-midline tongue extension Motor: 5/5 bilaterally with normal tone and bulk Cerebellar: Normal finger-to-nose  and normal heel-to-shin test.   Romberg negative Ambulates with a coordinated gait   Nursing note and vitals reviewed.   ED Course  Procedures (including critical care time) Labs Review Labs Reviewed  LIPASE, BLOOD - Abnormal; Notable for the following:    Lipase 21 (*)    All other components within normal limits  COMPREHENSIVE METABOLIC PANEL - Abnormal; Notable for the following:    ALT 12 (*)    All other components within normal limits  CBC - Abnormal; Notable for the following:    WBC 11.2 (*)    All other components within normal limits  URINALYSIS, ROUTINE W REFLEX MICROSCOPIC (NOT AT [facility])  POC URINE PREG, ED    Imaging Review No results found.   EKG Interpretation None      MDM   Final diagnoses:  RLQ abdominal pain    Filed Vitals:   02/03/15 1825 02/03/15 2200 02/03/15 2215  BP: 115/72 102/83 111/62  Pulse: 85 86 59  Temp: 98.3 F (36.8 C)    TempSrc: Oral    Resp: 16    SpO2: 93% 100% 97%    Medications  sodium chloride 0.9 % bolus 1,000 mL (not administered)  prochlorperazine (COMPAZINE) injection 10 mg (not administered)  diphenhydrAMINE (BENADRYL) injection 25 mg (not administered)  methylPREDNISolone sodium succinate (SOLU-MEDROL) 125 mg/2 mL injection 125 mg (not administered)    Molly Bowers is a pleasant 24 y.o. female presenting with right lower quadrant pain worsening over the course of 5  days with associated nausea, no vomiting, patient is eating and drinking normally. She is afebrile, abdominal exam is nonsurgical with mild tenderness to deep palpation of the right lower quadrant with no guarding, rebound, Rovsing psoas and obturator are negative. I do not think this is a appendicitis, will perform pelvic exam obtain pelvic ultrasound and give her a headache cocktail. Patient is is reluctant to obtain CT because she states she is claustrophobic, she understands is just a donut that she passes through and this is a very quick test. I think this is reasonable, I doubt that this is appendicitis but patient understands there is a risk of not obtaining definitive imaging.  Normal chemistries, patient has a mild leukocytosis of 11.3. Repeat abdominal exam with improved tenderness palpation over the right lower quadrant.  Ultrasound of pelvis with no significant abnormalities on the right side. Patient is 2 weeks status post menses, this may just be Mittelschmerz. Repeat abdominal exam is benign. Wet prep with clue cells however patient denies any abnormal vaginal discharge. No indication for treatment at this time.  We've had an extensive discussion of return precautions, patient is to return  to the ED for worsening pain, fever, chills, nausea, vomiting or any change in bowel habits.  Evaluation does not show pathology that would require ongoing emergent intervention or inpatient treatment. Pt is hemodynamically stable and mentating appropriately. Discussed findings and plan with patient/guardian, who agrees with care plan. All questions answered. Return precautions discussed and outpatient follow up given.   New Prescriptions   HYDROCODONE-ACETAMINOPHEN (NORCO/VICODIN) 5-325 MG PER TABLET    Take 1-2 tablets by mouth every 6 hours as needed for pain and/or cough.         Joni Reining Mcihael Hinderman, PA-C 02/03/15 6440  Gwyneth Sprout, MD 02/04/15 0002

## 2015-02-03 NOTE — ED Notes (Signed)
Pt reports to the ED for eval of migraines and RLQ abd pain. Pt reports she has been having migraines x 1 week and the RLQ pain has been occuring since Sunday. States the pain will radiate into her back. She has been taking ibuprofen with minimal relief. Reports nausea in the am but denies any active vomiting. Describes the pain as cramping. Pt reports that light does not affect her HA but sound does make it worse. Pt A&Ox4, resp e/u, and skin warm and dry.

## 2015-02-03 NOTE — Discharge Instructions (Signed)
Take vicodin for breakthrough pain, do not drink alcohol, drive, care for children or do other critical tasks while taking vicodin.  Return to the emergency room for severely worsening abdominal pain, abdominal pain that localizes to a particular area (especially the right lower part of the belly), pain that persists past 8-10 hours, blood in stool or vomit, severe weakness, fainting, or fever.   Maintain hydration by drinking small amounts of clear fluids frequently, then soft diet, and then advance to a solid diet as tolerated. Avoid foods that are spicy, high in fat or dairy.   Abdominal Pain, Women Abdominal (stomach, pelvic, or belly) pain can be caused by many things. It is important to tell your doctor:  The location of the pain.  Does it come and go or is it present all the time?  Are there things that start the pain (eating certain foods, exercise)?  Are there other symptoms associated with the pain (fever, nausea, vomiting, diarrhea)? All of this is helpful to know when trying to find the cause of the pain. CAUSES   Stomach: virus or bacteria infection, or ulcer.  Intestine: appendicitis (inflamed appendix), regional ileitis (Crohn's disease), ulcerative colitis (inflamed colon), irritable bowel syndrome, diverticulitis (inflamed diverticulum of the colon), or cancer of the stomach or intestine.  Gallbladder disease or stones in the gallbladder.  Kidney disease, kidney stones, or infection.  Pancreas infection or cancer.  Fibromyalgia (pain disorder).  Diseases of the female organs:  Uterus: fibroid (non-cancerous) tumors or infection.  Fallopian tubes: infection or tubal pregnancy.  Ovary: cysts or tumors.  Pelvic adhesions (scar tissue).  Endometriosis (uterus lining tissue growing in the pelvis and on the pelvic organs).  Pelvic congestion syndrome (female organs filling up with blood just before the menstrual period).  Pain with the menstrual  period.  Pain with ovulation (producing an egg).  Pain with an IUD (intrauterine device, birth control) in the uterus.  Cancer of the female organs.  Functional pain (pain not caused by a disease, may improve without treatment).  Psychological pain.  Depression. DIAGNOSIS  Your doctor will decide the seriousness of your pain by doing an examination.  Blood tests.  X-rays.  Ultrasound.  CT scan (computed tomography, special type of X-ray).  MRI (magnetic resonance imaging).  Cultures, for infection.  Barium enema (dye inserted in the large intestine, to better view it with X-rays).  Colonoscopy (looking in intestine with a lighted tube).  Laparoscopy (minor surgery, looking in abdomen with a lighted tube).  Major abdominal exploratory surgery (looking in abdomen with a large incision). TREATMENT  The treatment will depend on the cause of the pain.   Many cases can be observed and treated at home.  Over-the-counter medicines recommended by your caregiver.  Prescription medicine.  Antibiotics, for infection.  Birth control pills, for painful periods or for ovulation pain.  Hormone treatment, for endometriosis.  Nerve blocking injections.  Physical therapy.  Antidepressants.  Counseling with a psychologist or psychiatrist.  Minor or major surgery. HOME CARE INSTRUCTIONS   Do not take laxatives, unless directed by your caregiver.  Take over-the-counter pain medicine only if ordered by your caregiver. Do not take aspirin because it can cause an upset stomach or bleeding.  Try a clear liquid diet (broth or water) as ordered by your caregiver. Slowly move to a bland diet, as tolerated, if the pain is related to the stomach or intestine.  Have a thermometer and take your temperature several times a day, and record it.  Bed rest and sleep, if it helps the pain.  Avoid sexual intercourse, if it causes pain.  Avoid stressful situations.  Keep your  follow-up appointments and tests, as your caregiver orders.  If the pain does not go away with medicine or surgery, you may try:  Acupuncture.  Relaxation exercises (yoga, meditation).  Group therapy.  Counseling. SEEK MEDICAL CARE IF:   You notice certain foods cause stomach pain.  Your home care treatment is not helping your pain.  You need stronger pain medicine.  You want your IUD removed.  You feel faint or lightheaded.  You develop nausea and vomiting.  You develop a rash.  You are having side effects or an allergy to your medicine. SEEK IMMEDIATE MEDICAL CARE IF:   Your pain does not go away or gets worse.  You have a fever.  Your pain is felt only in portions of the abdomen. The right side could possibly be appendicitis. The left lower portion of the abdomen could be colitis or diverticulitis.  You are passing blood in your stools (bright red or black tarry stools, with or without vomiting).  You have blood in your urine.  You develop chills, with or without a fever.  You pass out. MAKE SURE YOU:   Understand these instructions.  Will watch your condition.  Will get help right away if you are not doing well or get worse. Document Released: 04/22/2007 Document Revised: 11/09/2013 Document Reviewed: 05/12/2009 [facility] Patient Information 2015 [place], [place]. This information is not intended to replace advice given to you by your health care provider. Make sure you discuss any questions you have with your health care provider.

## 2015-02-04 LAB — GC/CHLAMYDIA PROBE AMP (~~LOC~~) NOT AT ARMC
Chlamydia: NEGATIVE
NEISSERIA GONORRHEA: NEGATIVE

## 2015-02-09 ENCOUNTER — Telehealth (HOSPITAL_COMMUNITY): Payer: Self-pay

## 2015-02-09 NOTE — Telephone Encounter (Signed)
Pt calling for STD results.  ID verified x 2.  Pt informed both Gonorrhea and Chlamydia are negative. 

## 2015-03-02 ENCOUNTER — Inpatient Hospital Stay (HOSPITAL_COMMUNITY)
Admission: AD | Admit: 2015-03-02 | Discharge: 2015-03-02 | Disposition: A | Discharge status: Home / Self Care | Payer: Medicaid Other | Source: Ambulatory Visit | Attending: Obstetrics & Gynecology | Admitting: Obstetrics & Gynecology

## 2015-03-02 ENCOUNTER — Inpatient Hospital Stay (HOSPITAL_COMMUNITY): Payer: Medicaid Other

## 2015-03-02 ENCOUNTER — Encounter (HOSPITAL_COMMUNITY): Payer: Self-pay | Admitting: *Deleted

## 2015-03-02 DIAGNOSIS — F17200 Nicotine dependence, unspecified, uncomplicated: Secondary | ICD-10-CM | POA: Diagnosis not present

## 2015-03-02 DIAGNOSIS — O26899 Other specified pregnancy related conditions, unspecified trimester: Secondary | ICD-10-CM | POA: Diagnosis not present

## 2015-03-02 DIAGNOSIS — O9989 Other specified diseases and conditions complicating pregnancy, childbirth and the puerperium: Secondary | ICD-10-CM

## 2015-03-02 DIAGNOSIS — R109 Unspecified abdominal pain: Secondary | ICD-10-CM | POA: Diagnosis not present

## 2015-03-02 LAB — CBC WITH DIFFERENTIAL/PLATELET
BASOS ABS: 0 10*3/uL (ref 0.0–0.1)
Basophils Relative: 0 % (ref 0–1)
EOS PCT: 1 % (ref 0–5)
Eosinophils Absolute: 0.1 10*3/uL (ref 0.0–0.7)
HCT: 38.3 % (ref 36.0–46.0)
HEMOGLOBIN: 12.9 g/dL (ref 12.0–15.0)
LYMPHS PCT: 16 % (ref 12–46)
Lymphs Abs: 1.9 10*3/uL (ref 0.7–4.0)
MCH: 29.5 pg (ref 26.0–34.0)
MCHC: 33.7 g/dL (ref 30.0–36.0)
MCV: 87.4 fL (ref 78.0–100.0)
Monocytes Absolute: 0.9 10*3/uL (ref 0.1–1.0)
Monocytes Relative: 8 % (ref 3–12)
NEUTROS PCT: 75 % (ref 43–77)
Neutro Abs: 9 10*3/uL — ABNORMAL HIGH (ref 1.7–7.7)
PLATELETS: 256 10*3/uL (ref 150–400)
RBC: 4.38 MIL/uL (ref 3.87–5.11)
RDW: 13.6 % (ref 11.5–15.5)
WBC: 11.9 10*3/uL — AB (ref 4.0–10.5)

## 2015-03-02 LAB — URINE MICROSCOPIC-ADD ON

## 2015-03-02 LAB — URINALYSIS, ROUTINE W REFLEX MICROSCOPIC
GLUCOSE, UA: NEGATIVE mg/dL
KETONES UR: 15 mg/dL — AB
Nitrite: NEGATIVE
PH: 6 (ref 5.0–8.0)
PROTEIN: NEGATIVE mg/dL
Specific Gravity, Urine: 1.025 (ref 1.005–1.030)
Urobilinogen, UA: 4 mg/dL — ABNORMAL HIGH (ref 0.0–1.0)

## 2015-03-02 LAB — HCG, QUANTITATIVE, PREGNANCY: HCG, BETA CHAIN, QUANT, S: 1339 m[IU]/mL — AB (ref <5)

## 2015-03-02 LAB — POCT PREGNANCY, URINE: Preg Test, Ur: POSITIVE — AB

## 2015-03-02 NOTE — MAU Provider Note (Signed)
History     CSN: [id number]  Arrival date and time: 03/02/15 1721   None     Chief Complaint  Patient presents with  . Abdominal Pain  . Possible Pregnancy   HPI Comments: Molly Bowers is a 24 y.o. G2P1001 at [redacted]w[redacted]d who presents today with abdominal pain. She states that she has had the pain for about one month. She had a +UPT yesterday. She states that she was told in the past that she had a blocked tube.   Abdominal Pain This is a new problem. The current episode started 1 to 4 weeks ago. The onset quality is gradual. The problem occurs constantly. The problem has been gradually worsening. The pain is located in the suprapubic region. The pain is at a severity of 7/10. The pain is severe. The quality of the pain is cramping. The abdominal pain does not radiate. Associated symptoms include dysuria. Pertinent negatives include no constipation, diarrhea, fever, frequency, nausea or vomiting. Nothing aggravates the pain. The pain is relieved by nothing. She has tried nothing for the symptoms.  Possible Pregnancy This is a new problem. The current episode started yesterday. Associated symptoms include abdominal pain. Pertinent negatives include no fever, nausea or vomiting.    History reviewed. No pertinent past medical history.  History reviewed. No pertinent past surgical history.  History reviewed. No pertinent family history.  Social History  Substance Use Topics  . Smoking status: Current Some Day Smoker    Types: Cigarettes  . Smokeless tobacco: None  . Alcohol Use: Yes    Allergies:  Allergies  Allergen Reactions  . Peanut-Containing Drug Products Anaphylaxis  . Vancomycin Itching  . Penicillins Hives and Rash    Prescriptions prior to admission  Medication Sig Dispense Refill Last Dose  . EPINEPHrine (EPIPEN 2-PAK) 0.3 mg/0.3 mL IJ SOAJ injection Inject 0.3 mg into the muscle once.   rescue  . ibuprofen (ADVIL,MOTRIN) 200 MG tablet Take 400 mg by mouth every 6  (six) hours as needed for headache, mild pain or moderate pain.    03/02/2015 at 1500  . cephALEXin (KEFLEX) 500 MG capsule Take 1 capsule (500 mg total) by mouth 2 (two) times daily. (Patient not taking: Reported on 02/03/2015) 20 capsule 0 Completed Course at Unknown time  . HYDROcodone-acetaminophen (NORCO/VICODIN) 5-325 MG per tablet Take 1-2 tablets by mouth every 6 hours as needed for pain and/or cough. (Patient not taking: Reported on 03/02/2015) 7 tablet 0 Not Taking at Unknown time  . naproxen (NAPROSYN) 375 MG tablet Take 1 tablet (375 mg total) by mouth 2 (two) times daily. (Patient not taking: Reported on 02/03/2015) 20 tablet 0 Not Taking at Unknown time  . naproxen (NAPROSYN) 500 MG tablet Take 1 tablet (500 mg total) by mouth 2 (two) times daily. (Patient not taking: Reported on 02/03/2015) 30 tablet 0 Not Taking at Unknown time    Review of Systems  Constitutional: Negative for fever.  Gastrointestinal: Positive for abdominal pain. Negative for nausea, vomiting, diarrhea and constipation.  Genitourinary: Positive for dysuria. Negative for urgency and frequency.   Physical Exam   Blood pressure 115/67, pulse 91, temperature 98.5 F (36.9 C), temperature source Oral, resp. rate 18, last menstrual period 01/20/2015.  Physical Exam  Nursing note and vitals reviewed. Constitutional: She is oriented to person, place, and time. She appears well-developed and well-nourished. No distress.  HENT:  Head: Normocephalic.  Cardiovascular: Normal rate.   Respiratory: Effort normal.  GI: Soft. There is no tenderness. There  is no rebound.  Neurological: She is alert and oriented to person, place, and time.  Skin: Skin is warm and dry.  Psychiatric: She has a normal mood and affect.   Results for orders placed or performed during the hospital encounter of 03/02/15 (from the past 24 hour(s))  Urinalysis, Routine w reflex microscopic (not at [facility])     Status: Abnormal   Collection Time:  03/02/15  5:30 PM  Result Value Ref Range   Color, Urine YELLOW YELLOW   APPearance CLOUDY (A) CLEAR   Specific Gravity, Urine 1.025 1.005 - 1.030   pH 6.0 5.0 - 8.0   Glucose, UA NEGATIVE NEGATIVE mg/dL   Hgb urine dipstick TRACE (A) NEGATIVE   Bilirubin Urine SMALL (A) NEGATIVE   Ketones, ur 15 (A) NEGATIVE mg/dL   Protein, ur NEGATIVE NEGATIVE mg/dL   Urobilinogen, UA 4.0 (H) 0.0 - 1.0 mg/dL   Nitrite NEGATIVE NEGATIVE   Leukocytes, UA MODERATE (A) NEGATIVE  Urine microscopic-add on     Status: Abnormal   Collection Time: 03/02/15  5:30 PM  Result Value Ref Range   Squamous Epithelial / LPF MANY (A) RARE   WBC, UA 21-50 <3 WBC/hpf   RBC / HPF 3-6 <3 RBC/hpf   Bacteria, UA MANY (A) RARE   Urine-Other MUCOUS PRESENT   Pregnancy, urine POC     Status: Abnormal   Collection Time: 03/02/15  5:43 PM  Result Value Ref Range   Preg Test, Ur POSITIVE (A) NEGATIVE  CBC with Differential/Platelet     Status: Abnormal   Collection Time: 03/02/15  6:36 PM  Result Value Ref Range   WBC 11.9 (H) 4.0 - 10.5 K/uL   RBC 4.38 3.87 - 5.11 MIL/uL   Hemoglobin 12.9 12.0 - 15.0 g/dL   HCT 04.5 40.9 - 81.1 %   MCV 87.4 78.0 - 100.0 fL   MCH 29.5 26.0 - 34.0 pg   MCHC 33.7 30.0 - 36.0 g/dL   RDW 91.4 78.2 - 95.6 %   Platelets 256 150 - 400 K/uL   Neutrophils Relative % 75 43 - 77 %   Neutro Abs 9.0 (H) 1.7 - 7.7 K/uL   Lymphocytes Relative 16 12 - 46 %   Lymphs Abs 1.9 0.7 - 4.0 K/uL   Monocytes Relative 8 3 - 12 %   Monocytes Absolute 0.9 0.1 - 1.0 K/uL   Eosinophils Relative 1 0 - 5 %   Eosinophils Absolute 0.1 0.0 - 0.7 K/uL   Basophils Relative 0 0 - 1 %   Basophils Absolute 0.0 0.0 - 0.1 K/uL  hCG, quantitative, pregnancy     Status: Abnormal   Collection Time: 03/02/15  6:36 PM  Result Value Ref Range   hCG, Beta Chain, Quant, S 1339 (H) <5 mIU/mL   US Ob Comp Less 14 Wks  03/02/2015   ADDENDUM REPORT: 03/02/2015 21:35  ADDENDUM: Impression correction:  Intrauterine pregnancy  with gestational sac and yolk sac. No fetal pole.  Estimated gestational age by mean sac diameter equals 4 weeks 6 days  Findings conveyed toHeather sonographeron 03/02/2015  at21:34.   Electronically Signed   By: Genevive Bi M.D.   On: 03/02/2015 21:35   03/02/2015   CLINICAL DATA:  Lower abdominal pain for 1 month  EXAM: OBSTETRIC <14 WK [place] AND TRANSVAGINAL OB US  TECHNIQUE: Both transabdominal and transvaginal ultrasound examinations were performed for complete evaluation of the gestation as well as the maternal uterus, adnexal regions, and pelvic cul-de-sac.  Transvaginal technique was performed to assess early pregnancy.  COMPARISON:  None.  FINDINGS: Intrauterine gestational sac: Single  Yolk sac:  Present  Embryo:  Not identified  Cardiac Activity: Not identified  MSD: 3.8  mm   4 w   6  d  Maternal uterus/adnexae: Small subchorionic hemorrhage. Functional ovarian cyst of the RIGHT ovary normal LEFT ovary. No free fluid.  IMPRESSION: Probable early intrauterine gestational sac, but no yolk sac, fetal pole, or cardiac activity yet visualized. Recommend follow-up quantitative B-HCG levels and follow-up US in 14 days to confirm and assess viability. This recommendation follows SRU consensus guidelines: Diagnostic Criteria for Nonviable Pregnancy Early in the First Trimester. Malva Limes Med 2013; 161:0960-45.  Electronically Signed: By: Genevive Bi M.D. On: 03/02/2015 20:39   US Ob Transvaginal  03/02/2015   ADDENDUM REPORT: 03/02/2015 21:35  ADDENDUM: Impression correction:  Intrauterine pregnancy with gestational sac and yolk sac. No fetal pole.  Estimated gestational age by mean sac diameter equals 4 weeks 6 days  Findings conveyed toHeather sonographeron 03/02/2015  at21:34.   Electronically Signed   By: Genevive Bi M.D.   On: 03/02/2015 21:35   03/02/2015   CLINICAL DATA:  Lower abdominal pain for 1 month  EXAM: OBSTETRIC <14 WK [place] AND TRANSVAGINAL OB US  TECHNIQUE: Both transabdominal and  transvaginal ultrasound examinations were performed for complete evaluation of the gestation as well as the maternal uterus, adnexal regions, and pelvic cul-de-sac. Transvaginal technique was performed to assess early pregnancy.  COMPARISON:  None.  FINDINGS: Intrauterine gestational sac: Single  Yolk sac:  Present  Embryo:  Not identified  Cardiac Activity: Not identified  MSD: 3.8  mm   4 w   6  d  Maternal uterus/adnexae: Small subchorionic hemorrhage. Functional ovarian cyst of the RIGHT ovary normal LEFT ovary. No free fluid.  IMPRESSION: Probable early intrauterine gestational sac, but no yolk sac, fetal pole, or cardiac activity yet visualized. Recommend follow-up quantitative B-HCG levels and follow-up US in 14 days to confirm and assess viability. This recommendation follows SRU consensus guidelines: Diagnostic Criteria for Nonviable Pregnancy Early in the First Trimester. Malva Limes Med 2013; 409:8119-14.  Electronically Signed: By: Genevive Bi M.D. On: 03/02/2015 20:39     MAU Course  Procedures  MDM   Assessment and Plan   1. Abdominal pain affecting pregnancy     IUP with yolk sac on [place] today   DC home Comfort measures reviewed  1stTrimester precautions  RX: none  Return to MAU as needed FU with OB as planned  Follow-up Information    Schedule an appointment as soon as possible for a visit with Kathreen Cosier, MD.   Specialty:  Obstetrics and Gynecology   Contact information:   [address] [place] [address] [phone]         Tawnya Crook 03/02/2015, 9:22 PM

## 2015-03-02 NOTE — Discharge Instructions (Signed)
First Trimester of Pregnancy The first trimester of pregnancy is from week 1 until the end of week 12 (months 1 through 3). A week after a sperm fertilizes an egg, the egg will implant on the wall of the uterus. This embryo will begin to develop into a baby. Genes from you and your partner are forming the baby. The female genes determine whether the baby is a boy or a girl. At 6-8 weeks, the eyes and face are formed, and the heartbeat can be seen on ultrasound. At the end of 12 weeks, all the baby's organs are formed.  Now that you are pregnant, you will want to do everything you can to have a healthy baby. Two of the most important things are to get good prenatal care and to follow your health care provider's instructions. Prenatal care is all the medical care you receive before the baby's birth. This care will help prevent, find, and treat any problems during the pregnancy and childbirth. BODY CHANGES Your body goes through many changes during pregnancy. The changes vary from woman to woman.   You may gain or lose a couple of pounds at first.  You may feel sick to your stomach (nauseous) and throw up (vomit). If the vomiting is uncontrollable, call your health care provider.  You may tire easily.  You may develop headaches that can be relieved by medicines approved by your health care provider.  You may urinate more often. Painful urination may mean you have a bladder infection.  You may develop heartburn as a result of your pregnancy.  You may develop constipation because certain hormones are causing the muscles that push waste through your intestines to slow down.  You may develop hemorrhoids or swollen, bulging veins (varicose veins).  Your breasts may begin to grow larger and become tender. Your nipples may stick out more, and the tissue that surrounds them (areola) may become darker.  Your gums may bleed and may be sensitive to brushing and flossing.  Dark spots or blotches (chloasma,  mask of pregnancy) may develop on your face. This will likely fade after the baby is born.  Your menstrual periods will stop.  You may have a loss of appetite.  You may develop cravings for certain kinds of food.  You may have changes in your emotions from day to day, such as being excited to be pregnant or being concerned that something may go wrong with the pregnancy and baby.  You may have more vivid and strange dreams.  You may have changes in your hair. These can include thickening of your hair, rapid growth, and changes in texture. Some women also have hair loss during or after pregnancy, or hair that feels dry or thin. Your hair will most likely return to normal after your baby is born. WHAT TO EXPECT AT YOUR PRENATAL VISITS During a routine prenatal visit:  You will be weighed to make sure you and the baby are growing normally.  Your blood pressure will be taken.  Your abdomen will be measured to track your baby's growth.  The fetal heartbeat will be listened to starting around week 10 or 12 of your pregnancy.  Test results from any previous visits will be discussed. Your health care provider may ask you:  How you are feeling.  If you are feeling the baby move.  If you have had any abnormal symptoms, such as leaking fluid, bleeding, severe headaches, or abdominal cramping.  If you have any questions. Other tests   that may be performed during your first trimester include:  Blood tests to find your blood type and to check for the presence of any previous infections. They will also be used to check for low iron levels (anemia) and Rh antibodies. Later in the pregnancy, blood tests for diabetes will be done along with other tests if problems develop.  Urine tests to check for infections, diabetes, or protein in the urine.  An ultrasound to confirm the proper growth and development of the baby.  An amniocentesis to check for possible genetic problems.  Fetal screens for  spina bifida and Down syndrome.  You may need other tests to make sure you and the baby are doing well. HOME CARE INSTRUCTIONS  Medicines  Follow your health care provider's instructions regarding medicine use. Specific medicines may be either safe or unsafe to take during pregnancy.  Take your prenatal vitamins as directed.  If you develop constipation, try taking a stool softener if your health care provider approves. Diet  Eat regular, well-balanced meals. Choose a variety of foods, such as meat or vegetable-based protein, fish, milk and low-fat dairy products, vegetables, fruits, and whole grain breads and cereals. Your health care provider will help you determine the amount of weight gain that is right for you.  Avoid raw meat and uncooked cheese. These carry germs that can cause birth defects in the baby.  Eating four or five small meals rather than three large meals a day may help relieve nausea and vomiting. If you start to feel nauseous, eating a few soda crackers can be helpful. Drinking liquids between meals instead of during meals also seems to help nausea and vomiting.  If you develop constipation, eat more high-fiber foods, such as fresh vegetables or fruit and whole grains. Drink enough fluids to keep your urine clear or pale yellow. Activity and Exercise  Exercise only as directed by your health care provider. Exercising will help you:  Control your weight.  Stay in shape.  Be prepared for labor and delivery.  Experiencing pain or cramping in the lower abdomen or low back is a good sign that you should stop exercising. Check with your health care provider before continuing normal exercises.  Try to avoid standing for long periods of time. Move your legs often if you must stand in one place for a long time.  Avoid heavy lifting.  Wear low-heeled shoes, and practice good posture.  You may continue to have sex unless your health care provider directs you  otherwise. Relief of Pain or Discomfort  Wear a good support bra for breast tenderness.   Take warm sitz baths to soothe any pain or discomfort caused by hemorrhoids. Use hemorrhoid cream if your health care provider approves.   Rest with your legs elevated if you have leg cramps or low back pain.  If you develop varicose veins in your legs, wear support hose. Elevate your feet for 15 minutes, 3-4 times a day. Limit salt in your diet. Prenatal Care  Schedule your prenatal visits by the twelfth week of pregnancy. They are usually scheduled monthly at first, then more often in the last 2 months before delivery.  Write down your questions. Take them to your prenatal visits.  Keep all your prenatal visits as directed by your health care provider. Safety  Wear your seat belt at all times when driving.  Make a list of emergency phone numbers, including numbers for family, friends, the hospital, and police and fire departments. General Tips    Ask your health care provider for a referral to a local prenatal education class. Begin classes no later than at the beginning of month 6 of your pregnancy.  Ask for help if you have counseling or nutritional needs during pregnancy. Your health care provider can offer advice or refer you to specialists for help with various needs.  Do not use hot tubs, steam rooms, or saunas.  Do not douche or use tampons or scented sanitary pads.  Do not cross your legs for long periods of time.  Avoid cat litter boxes and soil used by cats. These carry germs that can cause birth defects in the baby and possibly loss of the fetus by miscarriage or stillbirth.  Avoid all smoking, herbs, alcohol, and medicines not prescribed by your health care provider. Chemicals in these affect the formation and growth of the baby.  Schedule a dentist appointment. At home, brush your teeth with a soft toothbrush and be gentle when you floss. SEEK MEDICAL CARE IF:   You have  dizziness.  You have mild pelvic cramps, pelvic pressure, or nagging pain in the abdominal area.  You have persistent nausea, vomiting, or diarrhea.  You have a bad smelling vaginal discharge.  You have pain with urination.  You notice increased swelling in your face, hands, legs, or ankles. SEEK IMMEDIATE MEDICAL CARE IF:   You have a fever.  You are leaking fluid from your vagina.  You have spotting or bleeding from your vagina.  You have severe abdominal cramping or pain.  You have rapid weight gain or loss.  You vomit blood or material that looks like coffee grounds.  You are exposed to German measles and have never had them.  You are exposed to fifth disease or chickenpox.  You develop a severe headache.  You have shortness of breath.  You have any kind of trauma, such as from a fall or a car accident. Document Released: 06/19/2001 Document Revised: 11/09/2013 Document Reviewed: 05/05/2013 [employer] Patient Information 2015 [employer]. This information is not intended to replace advice given to you by your health care provider. Make sure you discuss any questions you have with your health care provider.  

## 2015-03-02 NOTE — MAU Note (Signed)
Really bad pain in lower abd, for past month. +HPT yesterday.   Hx of blockage in one of her tubes.

## 2015-03-02 NOTE — MAU Note (Signed)
Burns at the end of urination, frequency and urgency

## 2015-03-02 NOTE — MAU Note (Signed)
Not in lobby

## 2015-03-03 LAB — HIV ANTIBODY (ROUTINE TESTING W REFLEX): HIV Screen 4th Generation wRfx: NONREACTIVE

## 2015-03-03 LAB — RPR: RPR: NONREACTIVE

## 2015-03-04 LAB — CULTURE, OB URINE

## 2015-03-05 ENCOUNTER — Other Ambulatory Visit: Payer: Self-pay | Admitting: Nurse Practitioner

## 2015-03-05 ENCOUNTER — Telehealth: Payer: Self-pay | Admitting: Nurse Practitioner

## 2015-03-05 MED ORDER — NITROFURANTOIN MONOHYD MACRO 100 MG PO CAPS: 100.0000 mg | ORAL_CAPSULE | Freq: Two times a day (BID) | ORAL | Status: DC

## 2015-03-05 NOTE — Telephone Encounter (Signed)
Reviewed lab results.  Client has positive urine culture in pregnancy and needs treatment.  Phone said person was unavailable and there was not an option to leave a message.  Will send message to the clinic to check and send letter but will also attempt to call again later this weekend to see if phone is working.

## 2015-03-06 ENCOUNTER — Emergency Department (HOSPITAL_COMMUNITY): Payer: Medicaid Other

## 2015-03-06 ENCOUNTER — Encounter (HOSPITAL_COMMUNITY): Payer: Self-pay

## 2015-03-06 ENCOUNTER — Emergency Department (HOSPITAL_COMMUNITY)
Admission: EM | Admit: 2015-03-06 | Discharge: 2015-03-06 | Disposition: A | Discharge status: Home / Self Care | Payer: Medicaid Other | Attending: Emergency Medicine | Admitting: Emergency Medicine

## 2015-03-06 DIAGNOSIS — O2341 Unspecified infection of urinary tract in pregnancy, first trimester: Secondary | ICD-10-CM | POA: Diagnosis not present

## 2015-03-06 DIAGNOSIS — R110 Nausea: Secondary | ICD-10-CM | POA: Insufficient documentation

## 2015-03-06 DIAGNOSIS — R1032 Left lower quadrant pain: Secondary | ICD-10-CM

## 2015-03-06 DIAGNOSIS — R300 Dysuria: Secondary | ICD-10-CM

## 2015-03-06 DIAGNOSIS — Z3A01 Less than 8 weeks gestation of pregnancy: Secondary | ICD-10-CM | POA: Diagnosis not present

## 2015-03-06 DIAGNOSIS — R1031 Right lower quadrant pain: Secondary | ICD-10-CM

## 2015-03-06 DIAGNOSIS — O9989 Other specified diseases and conditions complicating pregnancy, childbirth and the puerperium: Secondary | ICD-10-CM | POA: Diagnosis not present

## 2015-03-06 DIAGNOSIS — F17210 Nicotine dependence, cigarettes, uncomplicated: Secondary | ICD-10-CM | POA: Diagnosis not present

## 2015-03-06 DIAGNOSIS — O99331 Smoking (tobacco) complicating pregnancy, first trimester: Secondary | ICD-10-CM | POA: Diagnosis not present

## 2015-03-06 DIAGNOSIS — O209 Hemorrhage in early pregnancy, unspecified: Secondary | ICD-10-CM | POA: Diagnosis not present

## 2015-03-06 DIAGNOSIS — N390 Urinary tract infection, site not specified: Secondary | ICD-10-CM

## 2015-03-06 DIAGNOSIS — Z880 Allergy status to penicillin: Secondary | ICD-10-CM | POA: Diagnosis not present

## 2015-03-06 DIAGNOSIS — O360111 Maternal care for anti-D [Rh] antibodies, first trimester, fetus 1: Secondary | ICD-10-CM | POA: Diagnosis not present

## 2015-03-06 DIAGNOSIS — N760 Acute vaginitis: Secondary | ICD-10-CM

## 2015-03-06 DIAGNOSIS — O23591 Infection of other part of genital tract in pregnancy, first trimester: Secondary | ICD-10-CM | POA: Insufficient documentation

## 2015-03-06 DIAGNOSIS — G8929 Other chronic pain: Secondary | ICD-10-CM

## 2015-03-06 DIAGNOSIS — B9689 Other specified bacterial agents as the cause of diseases classified elsewhere: Secondary | ICD-10-CM

## 2015-03-06 LAB — WET PREP, GENITAL
TRICH WET PREP: NONE SEEN
YEAST WET PREP: NONE SEEN

## 2015-03-06 LAB — URINALYSIS, ROUTINE W REFLEX MICROSCOPIC
BILIRUBIN URINE: NEGATIVE
Glucose, UA: NEGATIVE mg/dL
KETONES UR: NEGATIVE mg/dL
NITRITE: POSITIVE — AB
Protein, ur: 100 mg/dL — AB
Specific Gravity, Urine: 1.022 (ref 1.005–1.030)
Urobilinogen, UA: 1 mg/dL (ref 0.0–1.0)
pH: 8.5 — ABNORMAL HIGH (ref 5.0–8.0)

## 2015-03-06 LAB — URINE MICROSCOPIC-ADD ON

## 2015-03-06 LAB — CBC
HCT: 39.5 % (ref 36.0–46.0)
Hemoglobin: 12.7 g/dL (ref 12.0–15.0)
MCH: 28.7 pg (ref 26.0–34.0)
MCHC: 32.2 g/dL (ref 30.0–36.0)
MCV: 89.4 fL (ref 78.0–100.0)
PLATELETS: 256 10*3/uL (ref 150–400)
RBC: 4.42 MIL/uL (ref 3.87–5.11)
RDW: 13.3 % (ref 11.5–15.5)
WBC: 12.2 10*3/uL — AB (ref 4.0–10.5)

## 2015-03-06 LAB — ABO/RH: ABO/RH(D): B NEG

## 2015-03-06 LAB — HCG, QUANTITATIVE, PREGNANCY: HCG, BETA CHAIN, QUANT, S: 4098 m[IU]/mL — AB (ref <5)

## 2015-03-06 MED ORDER — RHO D IMMUNE GLOBULIN 1500 UNIT/2ML IJ SOSY
300.0000 ug | PREFILLED_SYRINGE | Freq: Once | INTRAMUSCULAR | Status: AC
  Administered 2015-03-06: 300 ug via INTRAMUSCULAR
  Filled 2015-03-06: qty 2

## 2015-03-06 MED ORDER — NITROFURANTOIN MONOHYD MACRO 100 MG PO CAPS: 100.0000 mg | ORAL_CAPSULE | Freq: Two times a day (BID) | ORAL | Status: DC

## 2015-03-06 MED ORDER — METRONIDAZOLE 500 MG PO TABS: 500.0000 mg | ORAL_TABLET | Freq: Two times a day (BID) | ORAL | Status: DC

## 2015-03-06 NOTE — ED Notes (Signed)
Patient transported to Ultrasound 

## 2015-03-06 NOTE — Discharge Instructions (Signed)
Stay very well hydrated with plenty of water throughout the day. Take antibiotic until completed for your urinary tract infection. You also had bacterial vaginosis, take flagyl as directed. Take tylenol as needed for pain, but don't take advil or any NSAIDs since these are not safe in pregnancy. Your labs and ultrasound today were reassuring, but anytime a female has vaginal bleeding during pregnancy there is a risk for possible miscarriage. Follow up with your regular OBGYN in 2 days for recheck of ongoing symptoms but return to ER for emergent changing or worsening of symptoms. Please seek immediate care if you develop the following: You develop back pain.  Your symptoms are no better, or worse in 3 days. There is severe back pain or lower abdominal pain.  You develop chills.  You have a fever.  There is nausea or vomiting.  There is continued burning or discomfort with urination.     Pregnancy and Urinary Tract Infection A urinary tract infection (UTI) is a bacterial infection of the urinary tract. Infection of the urinary tract can include the ureters, kidneys (pyelonephritis), bladder (cystitis), and urethra (urethritis). All pregnant women should be screened for bacteria in the urinary tract. Identifying and treating a UTI will decrease the risk of preterm labor and developing more serious infections in both the mother and baby. CAUSES Bacteria germs cause almost all UTIs.  RISK FACTORS Many factors can increase your chances of getting a UTI during pregnancy. These include:  Having a short urethra.  Poor toilet and hygiene habits.  Sexual intercourse.  Blockage of urine along the urinary tract.  Problems with the pelvic muscles or nerves.  Diabetes.  Obesity.  Bladder problems after having several children.  Previous history of UTI. SIGNS AND SYMPTOMS   Pain, burning, or a stinging feeling when urinating.  Suddenly feeling the need to urinate right away (urgency).  Loss  of bladder control (urinary incontinence).  Frequent urination, more than is common with pregnancy.  Lower abdominal or back discomfort.  Cloudy urine.  Blood in the urine (hematuria).  Fever. When the kidneys are infected, the symptoms may be:  Back pain.  Flank pain on the right side more so than the left.  Fever.  Chills.  Nausea.  Vomiting. DIAGNOSIS  A urinary tract infection is usually diagnosed through urine tests. Additional tests and procedures are sometimes done. These may include:  Ultrasound exam of the kidneys, ureters, bladder, and urethra.  Looking in the bladder with a lighted tube (cystoscopy). TREATMENT Typically, UTIs can be treated with antibiotic medicines.  HOME CARE INSTRUCTIONS   Only take over-the-counter or prescription medicines as directed by your health care provider. If you were prescribed antibiotics, take them as directed. Finish them even if you start to feel better.  Drink enough fluids to keep your urine clear or pale yellow.  Do not have sexual intercourse until the infection is gone and your health care provider says it is okay.  Make sure you are tested for UTIs throughout your pregnancy. These infections often come back. Preventing a UTI in the Future  Practice good toilet habits. Always wipe from front to back. Use the tissue only once.  Do not hold your urine. Empty your bladder as soon as possible when the urge comes.  Do not douche or use deodorant sprays.  Wash with soap and warm water around the genital area and the anus.  Empty your bladder before and after sexual intercourse.  Wear underwear with a cotton crotch.  Avoid caffeine and carbonated drinks. They can irritate the bladder.  Drink cranberry juice or take cranberry pills. This may decrease the risk of getting a UTI.  Do not drink alcohol.  Keep all your appointments and tests as scheduled. SEEK MEDICAL CARE IF:   Your symptoms get worse.  You  are still having fevers 2 or more days after treatment begins.  You have a rash.  You feel that you are having problems with medicines prescribed.  You have abnormal vaginal discharge. SEEK IMMEDIATE MEDICAL CARE IF:   You have back or flank pain.  You have chills.  You have blood in your urine.  You have nausea and vomiting.  You have contractions of your uterus.  You have a gush of fluid from the vagina. MAKE SURE YOU:  Understand these instructions.   Will watch your condition.   Will get help right away if you are not doing well or get worse.  Document Released: 10/20/2010 Document Revised: 04/15/2013 Document Reviewed: 01/22/2013 [facility] Patient Information 2015 [place], [place]. This information is not intended to replace advice given to you by your health care provider. Make sure you discuss any questions you have with your health care provider.  Threatened Miscarriage A threatened miscarriage occurs when you have vaginal bleeding during your first 20 weeks of pregnancy but the pregnancy has not ended. If you have vaginal bleeding during this time, your health care provider will do tests to make sure you are still pregnant. If the tests show you are still pregnant and the developing baby (fetus) inside your womb (uterus) is still growing, your condition is considered a threatened miscarriage. A threatened miscarriage does not mean your pregnancy will end, but it does increase the risk of losing your pregnancy (complete miscarriage). CAUSES  The cause of a threatened miscarriage is usually not known. If you go on to have a complete miscarriage, the most common cause is an abnormal number of chromosomes in the developing baby. Chromosomes are the structures inside cells that hold all your genetic material. Some causes of vaginal bleeding that do not result in miscarriage include:  Having sex.  Having an infection.  Normal hormone changes of pregnancy.  Bleeding that  occurs when an egg implants in your uterus. RISK FACTORS Risk factors for bleeding in early pregnancy include:  Obesity.  Smoking.  Drinking excessive amounts of alcohol or caffeine.  Recreational drug use. SIGNS AND SYMPTOMS  Light vaginal bleeding.  Mild abdominal pain or cramps. DIAGNOSIS  If you have bleeding with or without abdominal pain before 20 weeks of pregnancy, your health care provider will do tests to check whether you are still pregnant. One important test involves using sound waves and a computer (ultrasound) to create images of the inside of your uterus. Other tests include an internal exam of your vagina and uterus (pelvic exam) and measurement of your baby's heart rate.  You may be diagnosed with a threatened miscarriage if:  Ultrasound testing shows you are still pregnant.  Your baby's heart rate is strong.  A pelvic exam shows that the opening between your uterus and your vagina (cervix) is closed.  Your heart rate and blood pressure are stable.  Blood tests confirm you are still pregnant. TREATMENT  No treatments have been shown to prevent a threatened miscarriage from going on to a complete miscarriage. However, the right home care is important.  HOME CARE INSTRUCTIONS   Make sure you keep all your appointments for prenatal care. This is  very important.  Get plenty of rest.  Do not have sex or use tampons if you have vaginal bleeding.  Do not douche.  Do not smoke or use recreational drugs.  Do not drink alcohol.  Avoid caffeine. SEEK MEDICAL CARE IF:  You have light vaginal bleeding or spotting while pregnant.  You have abdominal pain or cramping.  You have a fever. SEEK IMMEDIATE MEDICAL CARE IF:  You have heavy vaginal bleeding.  You have blood clots coming from your vagina.  You have severe low back pain or abdominal cramps.  You have fever, chills, and severe abdominal pain. MAKE SURE YOU:  Understand these  instructions.  Will watch your condition.  Will get help right away if you are not doing well or get worse. Document Released: 06/25/2005 Document Revised: 06/30/2013 Document Reviewed: 04/21/2013 [facility] Patient Information 2015 [place], [place]. This information is not intended to replace advice given to you by your health care provider. Make sure you discuss any questions you have with your health care provider.  Vaginal Bleeding During Pregnancy, First Trimester A small amount of bleeding (spotting) from the vagina is common in early pregnancy. Sometimes the bleeding is normal and is not a problem, and sometimes it is a sign of something serious. Be sure to tell your doctor about any bleeding from your vagina right away. HOME CARE  Watch your condition for any changes.  Follow your doctor's instructions about how active you can be.  If you are on bed rest:  You may need to stay in bed and only get up to use the bathroom.  You may be allowed to do some activities.  If you need help, make plans for someone to help you.  Write down:  The number of pads you use each day.  How often you change pads.  How soaked (saturated) your pads are.  Do not use tampons.  Do not douche.  Do not have sex or orgasms until your doctor says it is okay.  If you pass any tissue from your vagina, save the tissue so you can show it to your doctor.  Only take medicines as told by your doctor.  Do not take aspirin because it can make you bleed.  Keep all follow-up visits as told by your doctor. GET HELP IF:   You bleed from your vagina.  You have cramps.  You have labor pains.  You have a fever that does not go away after you take medicine. GET HELP RIGHT AWAY IF:   You have very bad cramps in your back or belly (abdomen).  You pass large clots or tissue from your vagina.  You bleed more.  You feel light-headed or weak.  You pass out (faint).  You have chills.  You are  leaking fluid or have a gush of fluid from your vagina.  You pass out while pooping (having a bowel movement). MAKE SURE YOU:  Understand these instructions.  Will watch your condition.  Will get help right away if you are not doing well or get worse. Document Released: 11/09/2013 Document Reviewed: 03/02/2013 [facility] Patient Information 2015 [place], [place]. This information is not intended to replace advice given to you by your health care provider. Make sure you discuss any questions you have with your health care provider.  Pelvic Pain Pelvic pain is pain felt below the belly button and between your hips. It can be caused by many different things. It is important to get help right away. This is  especially true for severe, sharp, or unusual pain that comes on suddenly.  HOME CARE  Only take medicine as told by your doctor.  Rest as told by your doctor.  Eat a healthy diet, such as fruits, vegetables, and lean meats.  Drink enough fluids to keep your pee (urine) clear or pale yellow, or as told.  Avoid sex (intercourse) if it causes pain.  Apply warm or cold packs to your lower belly (abdomen). Use the type of pack that helps the pain.  Avoid situations that cause you stress.  Keep a journal to track your pain. Write down:  When the pain started.  Where it is located.  If there are things that seem to be related to the pain, such as food or your period.  Follow up with your doctor as told. GET HELP RIGHT AWAY IF:   You have heavy bleeding from the vagina.  You have more pelvic pain.  You feel lightheaded or pass out (faint).  You have chills.  You have pain when you pee or have blood in your pee.  You cannot stop having watery poop (diarrhea).  You cannot stop throwing up (vomiting).  You have a fever or lasting symptoms for more than 3 days.  You have a fever and your symptoms suddenly get worse.  You are being physically or sexually abused.  Your  medicine does not help your pain.  You have fluid (discharge) coming from your vagina that is not normal. MAKE SURE YOU:  Understand these instructions.  Will watch your condition.  Will get help if you are not doing well or get worse. Document Released: 12/12/2007 Document Revised: 12/25/2011 Document Reviewed: 10/15/2011 [facility] Patient Information 2015 [place], [place]. This information is not intended to replace advice given to you by your health care provider. Make sure you discuss any questions you have with your health care provider.  Bacterial Vaginosis Bacterial vaginosis is an infection of the vagina. It happens when too many of certain germs (bacteria) grow in the vagina. HOME CARE  Take your medicine as told by your doctor.  Finish your medicine even if you start to feel better.  Do not have sex until you finish your medicine and are better.  Tell your sex partner that you have an infection. They should see their doctor for treatment.  Practice safe sex. Use condoms. Have only one sex partner. GET HELP IF:  You are not getting better after 3 days of treatment.  You have more grey fluid (discharge) coming from your vagina than before.  You have more pain than before.  You have a fever. MAKE SURE YOU:   Understand these instructions.  Will watch your condition.  Will get help right away if you are not doing well or get worse. Document Released: 04/03/2008 Document Revised: 04/15/2013 Document Reviewed: 02/04/2013 [facility] Patient Information 2015 [place], [place]. This information is not intended to replace advice given to you by your health care provider. Make sure you discuss any questions you have with your health care provider.

## 2015-03-06 NOTE — ED Notes (Signed)
Per pt, she is [redacted] weeks pregnant.  Pt noted vaginal bleeding on tissue once today.  Pt states abdominal pain.  Pt states burn when she is urinating.  No fever.  Pt is RH -

## 2015-03-06 NOTE — ED Provider Notes (Signed)
CSN: [id number]     Arrival date & time 03/06/15  1413 History   First MD Initiated Contact with Patient 03/06/15 1548     Chief Complaint  Patient presents with  . Vaginal Bleeding  . Routine Prenatal Visit     (Consider location/radiation/quality/duration/timing/severity/associated sxs/prior Treatment) HPI Comments: Molly Bowers is a 24 y.o. G71P1001 female who presents to the ED with complaints of scant vaginal bleeding that occurred once today upon wiping. She describes it is reddish, only on the toilet paper, with no passage of clots or tissue. She states that this has not reoccurred since that one incident. She has had no prior miscarriages, and no prior issues in pregnancy. She has been having one month of 6/10 lower abdominal intermittent crampy pain which is nonradiating, worse with intercourse, and relieved with Advil. She was seen at [facility] on the [facility]th and had an ultrasound which did reveal a single IUP with a yolk sac and gestational sac at 4 weeks 6 days. She also had a urinalysis which revealed a UTI, but she was unable to receive the message to have treatment due to issues with her phone. She describes some dysuria and urinary frequency, as well as some mild nausea.   She denies any fevers, chills, chest pain, shortness of breath, vomiting, diarrhea, constipation, melena, hematochezia, hematuria, vaginal discharge, vaginal itching, genital lesions, numbness, tingling, weakness, recent travel, sick contacts. She has had one sexual partner, unprotected.  Of note, one month ago she had a visit at [facility] during which they checked her for GC/CT and these were negative. She has not changed sexual partners since then.   Patient is a 24 y.o. female presenting with vaginal bleeding. The history is provided by the patient. No language interpreter was used.  Vaginal Bleeding Quality:  Dark red Severity:  Mild Onset quality:  Sudden Duration:  2 hours Timing:  Sporadic Progression:   Resolved Chronicity:  New Number of pads used:  0 Number of tampons used:  0 Possible pregnancy: yes   Context: spontaneously   Relieved by:  None tried Exacerbated by: wiping. Ineffective treatments:  None tried Associated symptoms: abdominal pain (ongoing x1 month, unchanged), dysuria and nausea (mild)   Associated symptoms: no fever and no vaginal discharge   Risk factors: no hx of ectopic pregnancy, does not have multiple partners and no prior miscarriage     History reviewed. No pertinent past medical history. History reviewed. No pertinent past surgical history. History reviewed. No pertinent family history. Social History  Substance Use Topics  . Smoking status: Current Some Day Smoker    Types: Cigarettes  . Smokeless tobacco: None  . Alcohol Use: Yes   OB History    Gravida Para Term Preterm AB TAB SAB Ectopic Multiple Living   2 1 1  0 0 0 0 0 0 1     Review of Systems  Constitutional: Negative for fever and chills.  Respiratory: Negative for shortness of breath.   Cardiovascular: Negative for chest pain.  Gastrointestinal: Positive for nausea (mild) and abdominal pain (ongoing x1 month, unchanged). Negative for vomiting, diarrhea, constipation and blood in stool.  Genitourinary: Positive for dysuria, frequency and vaginal bleeding. Negative for hematuria, flank pain, vaginal discharge and genital sores.  Musculoskeletal: Negative for myalgias and arthralgias.  Skin: Negative for color change.  Allergic/Immunologic: Negative for immunocompromised state.  Neurological: Negative for weakness and numbness.  Psychiatric/Behavioral: Negative for confusion.   10 Systems reviewed and are negative for acute change except  as noted in the HPI.    Allergies  Peanut-containing drug products; Vancomycin; and Penicillins  Home Medications   Prior to Admission medications   Medication Sig Start Date End Date Taking? Authorizing Provider  EPINEPHrine (EPIPEN 2-PAK) 0.3  mg/0.3 mL IJ SOAJ injection Inject 0.3 mg into the muscle once.   Yes Historical Provider, MD  ibuprofen (ADVIL,MOTRIN) 200 MG tablet Take 400 mg by mouth every 6 (six) hours as needed for headache, mild pain or moderate pain.    Yes Historical Provider, MD  nitrofurantoin, macrocrystal-monohydrate, (MACROBID) 100 MG capsule Take 1 capsule (100 mg total) by mouth 2 (two) times daily. Patient not taking: Reported on 03/06/2015 03/05/15   Currie Paris, NP   BP 111/58 mmHg  Pulse 82  Temp(Src) 98.9 F (37.2 C) (Oral)  Resp 18  Ht 5\' 5"  (1.651 m)  SpO2 100%  LMP 01/20/2015 Physical Exam  Constitutional: She is oriented to person, place, and time. Vital signs are normal. She appears well-developed and well-nourished.  Non-toxic appearance. No distress.  Afebrile, nontoxic, NAD  HENT:  Head: Normocephalic and atraumatic.  Mouth/Throat: Oropharynx is clear and moist and mucous membranes are normal.  Eyes: Conjunctivae and EOM are normal. Right eye exhibits no discharge. Left eye exhibits no discharge.  Neck: Normal range of motion. Neck supple.  Cardiovascular: Normal rate, regular rhythm, normal heart sounds and intact distal pulses.  Exam reveals no gallop and no friction rub.   No murmur heard. Pulmonary/Chest: Effort normal and breath sounds normal. No respiratory distress. She has no decreased breath sounds. She has no wheezes. She has no rhonchi. She has no rales.  Abdominal: Soft. Normal appearance and bowel sounds are normal. She exhibits no distension. There is tenderness in the suprapubic area. There is no rigidity, no rebound, no guarding, no CVA tenderness, no tenderness at McBurney's point and negative Murphy's sign.    Soft, nondistended, +BS throughout, with mild suprapubic TTP, no r/g/r, neg murphy's, neg mcburney's, no CVA TTP   Genitourinary: Uterus normal. Pelvic exam was performed with patient supine. There is no rash, tenderness or lesion on the right labia. There is no  rash, tenderness or lesion on the left labia. Cervix exhibits discharge (thin clear). Cervix exhibits no motion tenderness and no friability. Right adnexum displays no mass, no tenderness and no fullness. Left adnexum displays no mass, no tenderness and no fullness. No erythema or bleeding in the vagina. No vaginal discharge found.  Chaperone present for exam. No rashes, lesions, or tenderness to external genitalia. No erythema, injury, or tenderness to vaginal mucosa. No vaginal discharge or bleeding within vaginal vault. No adnexal masses, tenderness, or fullness. No CMT or cervical friability. Very thin clear physiologic discharge noted from cervical os. Uterus non-deviated, mobile, nonTTP, and without enlargement.    Musculoskeletal: Normal range of motion.  Neurological: She is alert and oriented to person, place, and time. She has normal strength. No sensory deficit.  Skin: Skin is warm, dry and intact. No rash noted.  Psychiatric: She has a normal mood and affect.  Nursing note and vitals reviewed.   ED Course  Procedures (including critical care time) Labs Review Labs Reviewed  WET PREP, GENITAL - Abnormal; Notable for the following:    Clue Cells Wet Prep HPF POC FEW (*)    WBC, Wet Prep HPF POC FEW (*)    All other components within normal limits  URINALYSIS, ROUTINE W REFLEX MICROSCOPIC (NOT AT [facility]) - Abnormal; Notable for the following:  APPearance TURBID (*)    pH 8.5 (*)    Hgb urine dipstick SMALL (*)    Protein, ur 100 (*)    Nitrite POSITIVE (*)    Leukocytes, UA LARGE (*)    All other components within normal limits  HCG, QUANTITATIVE, PREGNANCY - Abnormal; Notable for the following:    hCG, Beta Chain, Quant, S 4098 (*)    All other components within normal limits  CBC - Abnormal; Notable for the following:    WBC 12.2 (*)    All other components within normal limits  URINE MICROSCOPIC-ADD ON - Abnormal; Notable for the following:    Bacteria, UA MANY (*)     All other components within normal limits  ABO/RH  RH IG WORKUP (INCLUDES ABO/RH)  GC/CHLAMYDIA PROBE AMP ([place]) NOT AT [facility]   Results for orders placed or performed during the hospital encounter of 03/02/15  Culture, OB Urine  Result Value Ref Range   Specimen Description URINE, CLEAN CATCH    Special Requests NONE    Culture      >=100,000 COLONIES/mL ESCHERICHIA COLI Performed at [facility]    Report Status 03/04/2015 FINAL    Organism ID, Bacteria ESCHERICHIA COLI       Susceptibility   Escherichia coli - MIC*    AMPICILLIN <=2 SENSITIVE Sensitive     CEFAZOLIN <=4 SENSITIVE Sensitive     CEFTRIAXONE <=1 SENSITIVE Sensitive     CIPROFLOXACIN <=0.25 SENSITIVE Sensitive     GENTAMICIN 2 SENSITIVE Sensitive     IMIPENEM <=0.25 SENSITIVE Sensitive     NITROFURANTOIN <=16 SENSITIVE Sensitive     TRIMETH/SULFA <=20 SENSITIVE Sensitive     AMPICILLIN/SULBACTAM <=2 SENSITIVE Sensitive     PIP/TAZO <=4 SENSITIVE Sensitive     * >=100,000 COLONIES/mL ESCHERICHIA COLI  Urinalysis, Routine w reflex microscopic (not at [facility])  Result Value Ref Range   Color, Urine YELLOW YELLOW   APPearance CLOUDY (A) CLEAR   Specific Gravity, Urine 1.025 1.005 - 1.030   pH 6.0 5.0 - 8.0   Glucose, UA NEGATIVE NEGATIVE mg/dL   Hgb urine dipstick TRACE (A) NEGATIVE   Bilirubin Urine SMALL (A) NEGATIVE   Ketones, ur 15 (A) NEGATIVE mg/dL   Protein, ur NEGATIVE NEGATIVE mg/dL   Urobilinogen, UA 4.0 (H) 0.0 - 1.0 mg/dL   Nitrite NEGATIVE NEGATIVE   Leukocytes, UA MODERATE (A) NEGATIVE  Urine microscopic-add on  Result Value Ref Range   Squamous Epithelial / LPF MANY (A) RARE   WBC, UA 21-50 <3 WBC/hpf   RBC / HPF 3-6 <3 RBC/hpf   Bacteria, UA MANY (A) RARE   Urine-Other MUCOUS PRESENT   CBC with Differential/Platelet  Result Value Ref Range   WBC 11.9 (H) 4.0 - 10.5 K/uL   RBC 4.38 3.87 - 5.11 MIL/uL   Hemoglobin 12.9 12.0 - 15.0 g/dL   HCT 16.1 09.6 - 04.5 %   MCV 87.4 78.0  - 100.0 fL   MCH 29.5 26.0 - 34.0 pg   MCHC 33.7 30.0 - 36.0 g/dL   RDW 40.9 81.1 - 91.4 %   Platelets 256 150 - 400 K/uL   Neutrophils Relative % 75 43 - 77 %   Neutro Abs 9.0 (H) 1.7 - 7.7 K/uL   Lymphocytes Relative 16 12 - 46 %   Lymphs Abs 1.9 0.7 - 4.0 K/uL   Monocytes Relative 8 3 - 12 %   Monocytes Absolute 0.9 0.1 - 1.0 K/uL  Eosinophils Relative 1 0 - 5 %   Eosinophils Absolute 0.1 0.0 - 0.7 K/uL   Basophils Relative 0 0 - 1 %   Basophils Absolute 0.0 0.0 - 0.1 K/uL  hCG, quantitative, pregnancy  Result Value Ref Range   hCG, Beta Chain, Quant, S 1339 (H) <5 mIU/mL  HIV antibody  Result Value Ref Range   HIV Screen 4th Generation wRfx Non Reactive Non Reactive  RPR  Result Value Ref Range   RPR Ser Ql Non Reactive Non Reactive  Pregnancy, urine POC  Result Value Ref Range   Preg Test, Ur POSITIVE (A) NEGATIVE   Results for LUVINA, POIRIER (MRN [id number]) as of 03/06/2015 16:28  Ref. Range 02/03/2015 00:00  Chlamydia Unknown Negative  Neisseria gonorrhea Unknown Negative     Imaging Review US Ob Comp Less 14 Wks  03/06/2015   CLINICAL DATA:  Vaginal bleeding and hematuria. Quantitative beta HCG pending. Previous quantitative HCG 1,339 03/02/2015. Estimated gestational age [redacted] weeks 3 days per LMP.  EXAM: OBSTETRIC <14 WK [place] AND TRANSVAGINAL OB US  TECHNIQUE: Both transabdominal and transvaginal ultrasound examinations were performed for complete evaluation of the gestation as well as the maternal uterus, adnexal regions, and pelvic cul-de-sac. Transvaginal technique was performed to assess early pregnancy.  COMPARISON:  03/02/2015  FINDINGS: Intrauterine gestational sac: Visualized/normal in shape.  Yolk sac:  Visualized.  Embryo:  Visualized.  Cardiac Activity: Not visualized.  Heart Rate: Not visualized.  CRL:  2  mm   5 w   5 d                  [place] EDC: 11/01/2015  No evidence of subchorionic hemorrhage.  Maternal uterus/adnexae: Ovaries are within normal there is a 3  cm simple right ovarian cyst likely corpus luteal cyst. There is a simple 2.6 cm cyst within or adjacent to the left ovary. No free pelvic fluid.  Transabdominal images demonstrate the focal somewhat lobular echogenic area along the dependent portion of the bladder just left of midline likely representing hemorrhagic debris/clot and less likely a mass.  IMPRESSION: Intrauterine gestational sac with yolk sac and embryo. Estimated gestational age 284 weeks 5 days. No cardiac activity demonstrated at this time. This likely represents a normal early intrauterine pregnancy as recommend correlation with serial quantitative HCG and follow-up ultrasound in 10- 14 days.  Lobular echogenic focus along the left-sided the deep and portion of the bladder likely hemorrhagic debris/ clot in this patient with hematuria and less likely mass.   Electronically Signed   By: Elberta Fortis M.D.   On: 03/06/2015 18:31   US Ob Transvaginal  03/06/2015   CLINICAL DATA:  Vaginal bleeding and hematuria. Quantitative beta HCG pending. Previous quantitative HCG 1,339 03/02/2015. Estimated gestational age [redacted] weeks 3 days per LMP.  EXAM: OBSTETRIC <14 WK [place] AND TRANSVAGINAL OB US  TECHNIQUE: Both transabdominal and transvaginal ultrasound examinations were performed for complete evaluation of the gestation as well as the maternal uterus, adnexal regions, and pelvic cul-de-sac. Transvaginal technique was performed to assess early pregnancy.  COMPARISON:  03/02/2015  FINDINGS: Intrauterine gestational sac: Visualized/normal in shape.  Yolk sac:  Visualized.  Embryo:  Visualized.  Cardiac Activity: Not visualized.  Heart Rate: Not visualized.  CRL:  2  mm   5 w   5 d                  [place] EDC: 11/01/2015  No evidence of subchorionic hemorrhage.  Maternal  uterus/adnexae: Ovaries are within normal there is a 3 cm simple right ovarian cyst likely corpus luteal cyst. There is a simple 2.6 cm cyst within or adjacent to the left ovary. No free pelvic fluid.   Transabdominal images demonstrate the focal somewhat lobular echogenic area along the dependent portion of the bladder just left of midline likely representing hemorrhagic debris/clot and less likely a mass.  IMPRESSION: Intrauterine gestational sac with yolk sac and embryo. Estimated gestational age [redacted] weeks 5 days. No cardiac activity demonstrated at this time. This likely represents a normal early intrauterine pregnancy as recommend correlation with serial quantitative HCG and follow-up ultrasound in 10- 14 days.  Lobular echogenic focus along the left-sided the deep and portion of the bladder likely hemorrhagic debris/ clot in this patient with hematuria and less likely mass.   Electronically Signed   By: Elberta Fortis M.D.   On: 03/06/2015 18:31   I have personally reviewed and evaluated these images and lab results as part of my medical decision-making. US Ob Comp Less 14 Wks  03/02/2015   ADDENDUM REPORT: 03/02/2015 21:35  ADDENDUM: Impression correction:  Intrauterine pregnancy with gestational sac and yolk sac. No fetal pole.  Estimated gestational age by mean sac diameter equals 4 weeks 6 days  Findings conveyed toHeather sonographeron 03/02/2015  at21:34.   Electronically Signed   By: Genevive Bi M.D.   On: 03/02/2015 21:35   03/02/2015   CLINICAL DATA:  Lower abdominal pain for 1 month  EXAM: OBSTETRIC <14 WK [place] AND TRANSVAGINAL OB US  TECHNIQUE: Both transabdominal and transvaginal ultrasound examinations were performed for complete evaluation of the gestation as well as the maternal uterus, adnexal regions, and pelvic cul-de-sac. Transvaginal technique was performed to assess early pregnancy.  COMPARISON:  None.  FINDINGS: Intrauterine gestational sac: Single  Yolk sac:  Present  Embryo:  Not identified  Cardiac Activity: Not identified  MSD: 3.8  mm   4 w   6  d  Maternal uterus/adnexae: Small subchorionic hemorrhage. Functional ovarian cyst of the RIGHT ovary normal LEFT ovary. No free  fluid.  IMPRESSION: Probable early intrauterine gestational sac, but no yolk sac, fetal pole, or cardiac activity yet visualized. Recommend follow-up quantitative B-HCG levels and follow-up US in 14 days to confirm and assess viability. This recommendation follows SRU consensus guidelines: Diagnostic Criteria for Nonviable Pregnancy Early in the First Trimester. Malva Limes Med 2013; 161:0960-45.  Electronically Signed: By: Genevive Bi M.D. On: 03/02/2015 20:39   US Ob Transvaginal  03/02/2015   ADDENDUM REPORT: 03/02/2015 21:35  ADDENDUM: Impression correction:  Intrauterine pregnancy with gestational sac and yolk sac. No fetal pole.  Estimated gestational age by mean sac diameter equals 4 weeks 6 days  Findings conveyed toHeather sonographeron 03/02/2015  at21:34.   Electronically Signed   By: Genevive Bi M.D.   On: 03/02/2015 21:35   03/02/2015   CLINICAL DATA:  Lower abdominal pain for 1 month  EXAM: OBSTETRIC <14 WK [place] AND TRANSVAGINAL OB US  TECHNIQUE: Both transabdominal and transvaginal ultrasound examinations were performed for complete evaluation of the gestation as well as the maternal uterus, adnexal regions, and pelvic cul-de-sac. Transvaginal technique was performed to assess early pregnancy.  COMPARISON:  None.  FINDINGS: Intrauterine gestational sac: Single  Yolk sac:  Present  Embryo:  Not identified  Cardiac Activity: Not identified  MSD: 3.8  mm   4 w   6  d  Maternal uterus/adnexae: Small subchorionic hemorrhage. Functional ovarian cyst  of the RIGHT ovary normal LEFT ovary. No free fluid.  IMPRESSION: Probable early intrauterine gestational sac, but no yolk sac, fetal pole, or cardiac activity yet visualized. Recommend follow-up quantitative B-HCG levels and follow-up US in 14 days to confirm and assess viability. This recommendation follows SRU consensus guidelines: Diagnostic Criteria for Nonviable Pregnancy Early in the First Trimester. Malva Limes Med 2013; 782:9562-13.  Electronically  Signed: By: Genevive Bi M.D. On: 03/02/2015 20:39      EKG Interpretation None      MDM   Final diagnoses:  Vaginal bleeding before [redacted] weeks gestation  UTI (lower urinary tract infection)  Abdominal pain, chronic, bilateral lower quadrant  Dysuria  BV (bacterial vaginosis)  Rh negative state in antepartum period, first trimester, fetus 1    24 y.o. female here with vaginal bleeding after wiping. Also has some mild lower abd pain x1 month. Was seen at [facility] 4 days ago for this, had u/s which confirmed IUP with gestational and yolk sacs, no fetal pole, measuring [redacted]w[redacted]d. Also had neg HIV/RPR at that time. U/A returned with culture growing out e.coli, [facility] of her culture results, so has not yet been treated. Will proceed with tx today. U/A was collected here already, unable to cancel this now. Will obtain CBC, quant hcg, ABO/Rh, wet prep, GC/CT, and perform pelvic. Will also obtain u/s again today given this new symptom. Will reassess shortly.   4:30 PM Pelvic exam unremarkable, no CMT or pelvic tenderness, no vaginal discharge. Will await labs.   7:31 PM ABO/Rh with Bneg, will give rhogam. CBC with mildly elevated WBC at 12.2, similar to prior results. HCG at 4098 which shows an appropriate rise since 03/02/15 (up from 1339 then). U/A again demonstrates UTI with +nitrites, +leuks, TNTC WBC, and many bacteria. Will send home with macrobid. Wet prep with few clue cells, will treat for BV since pt is pregnant. U/S again showing IUP at [redacted]w[redacted]d with yolk sac but no cardiac activity, which is expected at this gestational age. Will d/c home with f/up with OBGYN in 2 days for recheck. I explained the diagnosis and have given explicit precautions to return to the ER including for any other new or worsening symptoms. The patient understands and accepts the medical plan as it's been dictated and I have answered their questions. Discharge  instructions concerning home care and prescriptions have been given. The patient is STABLE and is discharged to home in good condition.  BP 115/56 mmHg  Pulse 69  Temp(Src) 98.9 F (37.2 C) (Oral)  Resp 18  Ht  (1.651 m)  SpO2 97%  LMP 01/20/2015  Meds ordered this encounter  Medications  . rho (d) immune globulin (RHIG/RHOPHYLAC) injection 300 mcg    Sig:   . nitrofurantoin, macrocrystal-monohydrate, (MACROBID) 100 MG capsule    Sig: Take 1 capsule (100 mg total) by mouth 2 (two) times daily. X 7 days    Dispense:  14 capsule    Refill:  0    Order Specific Question:  Supervising Provider    Answer:  MILLER, BRIAN [[id number]]  . metroNIDAZOLE (FLAGYL) 500 MG tablet    Sig: Take 1 tablet (500 mg total) by mouth 2 (two) times daily. One po bid x 7 days    Dispense:  14 tablet    Refill:  0    Order Specific Question:  Supervising Provider    Answer:  Eber Hong [[id number]]  [address] Cacie Gaskins [place], PA-C 03/06/15 1942  Cathren Laine, MD 03/06/15 [phone]

## 2015-03-07 LAB — RH IG WORKUP (INCLUDES ABO/RH)
ABO/RH(D): B NEG
ANTIBODY SCREEN: NEGATIVE
Unit division: 0

## 2015-03-07 LAB — GC/CHLAMYDIA PROBE AMP (~~LOC~~) NOT AT ARMC
Chlamydia: NEGATIVE
NEISSERIA GONORRHEA: NEGATIVE

## 2015-03-08 ENCOUNTER — Telehealth: Payer: Self-pay | Admitting: General Practice

## 2015-03-08 NOTE — Telephone Encounter (Signed)
Telephone call to patient regarding + UTI and Rx at pharmacy. No answer, no option to leave message

## 2015-03-08 NOTE — Telephone Encounter (Addendum)
Attempted to contact patient concerning Positive UTI/prescription. No answer, unable to leave a message.  8/31  0845  Called pt and again unable to speak w/pt or leave message. Per chart review, pt was seen @ [facility] on 8/28, was told she had UTI and was given printed Rx for Macrobid. Notes also reflect that pt was agreeable to plan of care. In light of this information, no further calls needed to pt at this time.   Diane Day RNC

## 2015-03-24 ENCOUNTER — Emergency Department (HOSPITAL_COMMUNITY)
Admission: EM | Admit: 2015-03-24 | Discharge: 2015-03-24 | Disposition: A | Discharge status: Home / Self Care | Payer: Medicaid Other | Attending: Emergency Medicine | Admitting: Emergency Medicine

## 2015-03-24 ENCOUNTER — Encounter (HOSPITAL_COMMUNITY): Payer: Self-pay | Admitting: Emergency Medicine

## 2015-03-24 ENCOUNTER — Emergency Department (HOSPITAL_COMMUNITY): Payer: Medicaid Other

## 2015-03-24 DIAGNOSIS — O9989 Other specified diseases and conditions complicating pregnancy, childbirth and the puerperium: Secondary | ICD-10-CM | POA: Diagnosis not present

## 2015-03-24 DIAGNOSIS — R1030 Lower abdominal pain, unspecified: Secondary | ICD-10-CM | POA: Diagnosis not present

## 2015-03-24 DIAGNOSIS — Z79899 Other long term (current) drug therapy: Secondary | ICD-10-CM | POA: Diagnosis not present

## 2015-03-24 DIAGNOSIS — Z3A08 8 weeks gestation of pregnancy: Secondary | ICD-10-CM | POA: Insufficient documentation

## 2015-03-24 DIAGNOSIS — Z87891 Personal history of nicotine dependence: Secondary | ICD-10-CM | POA: Diagnosis not present

## 2015-03-24 DIAGNOSIS — Z3490 Encounter for supervision of normal pregnancy, unspecified, unspecified trimester: Secondary | ICD-10-CM

## 2015-03-24 DIAGNOSIS — R102 Pelvic and perineal pain: Secondary | ICD-10-CM

## 2015-03-24 LAB — WET PREP, GENITAL
Clue Cells Wet Prep HPF POC: NONE SEEN
TRICH WET PREP: NONE SEEN
YEAST WET PREP: NONE SEEN

## 2015-03-24 LAB — HCG, QUANTITATIVE, PREGNANCY: HCG, BETA CHAIN, QUANT, S: 43870 m[IU]/mL — AB (ref <5)

## 2015-03-24 MED ORDER — ACETAMINOPHEN 500 MG PO TABS
1000.0000 mg | ORAL_TABLET | Freq: Once | ORAL | Status: AC
Start: 2015-03-24 — End: 2015-03-24
  Administered 2015-03-24: 1000 mg via ORAL
  Filled 2015-03-24: qty 2

## 2015-03-24 NOTE — ED Notes (Signed)
Patient c/o low abd cramping following intercourse. Patient states she is 2 months pregnant. Patient is seen by Dr Oneita Hurt, has not been seen for first appt yet. Patient eating/drinking in lobby when called to treatment room. Patient was advised nothing else to eat or drink until ok by MD. [facility] she states she does not want to go there "they don't know what they are doing". Patient was advised seeking care at [facility] for OBGYN needs would be her best course of action in the future.

## 2015-03-24 NOTE — ED Provider Notes (Signed)
CSN: [id number]     Arrival date & time 03/24/15  0755 History   First MD Initiated Contact with Patient 03/24/15 [phone]     Chief Complaint  Patient presents with  . Abdominal Cramping    2 months pregnant     (Consider location/radiation/quality/duration/timing/severity/associated sxs/prior Treatment) HPI Complains of suprapubic cramping onset last night 10 PM after having sexual intercourse. No vaginal bleeding. No dysuria. Admits to vaginal discharge for several days. Patient seen here 03/06/2015 had pelvic ultrasound showing 5 week 5 day IUP recommended pelvic ultrasound 10-14 days. She has not followed up. No other associated symptoms. She was treated with nitrofurantoin for UTI. No other associated symptoms. No treatment prior to coming here. Nothing makes symptoms better or worse History reviewed. No pertinent past medical history. she denies urinary symptoms Past Surgical History  Procedure Laterality Date  . Keloid excision     History reviewed. No pertinent family history. Social History  Substance Use Topics  . Smoking status: Former Smoker    Types: Cigarettes    Quit date: 12/24/2014  . Smokeless tobacco: None  . Alcohol Use: Yes     Comment: denies 03/24/2015   OB History    Gravida Para Term Preterm AB TAB SAB Ectopic Multiple Living   0 0 0 0 0 0 1     Review of Systems  Constitutional: Negative.   HENT: Negative.   Respiratory: Negative.   Cardiovascular: Negative.   Gastrointestinal: Positive for abdominal pain.  Genitourinary:       Pregnant  Musculoskeletal: Negative.   Skin: Negative.   Neurological: Negative.   Psychiatric/Behavioral: Negative.       Allergies  Peanut-containing drug products; Vancomycin; and Penicillins  Home Medications  Prenatal vitamins Prior to Admission medications   Medication Sig Start Date End Date Taking? Authorizing Provider  EPINEPHrine (EPIPEN 2-PAK) 0.3 mg/0.3 mL IJ SOAJ injection Inject 0.3 mg into the  muscle once.    Historical Provider, MD  ibuprofen (ADVIL,MOTRIN) 200 MG tablet Take 400 mg by mouth every 6 (six) hours as needed for headache, mild pain or moderate pain.     Historical Provider, MD  metroNIDAZOLE (FLAGYL) 500 MG tablet Take 1 tablet (500 mg total) by mouth 2 (two) times daily. One po bid x 7 days 03/06/15   Mercedes Camprubi-Soms, PA-C  nitrofurantoin, macrocrystal-monohydrate, (MACROBID) 100 MG capsule Take 1 capsule (100 mg total) by mouth 2 (two) times daily. Patient not taking: Reported on 03/06/2015 03/05/15   Currie Paris, NP  nitrofurantoin, macrocrystal-monohydrate, (MACROBID) 100 MG capsule Take 1 capsule (100 mg total) by mouth 2 (two) times daily. X 7 days 03/06/15   Mercedes Camprubi-Soms, PA-C   BP 110/66 mmHg  Pulse 75  Temp(Src) 99.5 F (37.5 C) (Oral)  Resp 20  Ht 5' 5.5" (1.664 m)  Wt 233 lb (105.688 kg)  BMI 38.17 kg/m2  SpO2 100%  LMP 01/20/2015 Physical Exam  Constitutional: She appears well-developed and well-nourished.  HENT:  Head: Normocephalic and atraumatic.  Eyes: Conjunctivae are normal. Pupils are equal, round, and reactive to light.  Neck: Neck supple. No tracheal deviation present. No thyromegaly present.  Cardiovascular: Normal rate and regular rhythm.   No murmur heard. Pulmonary/Chest: Effort normal and breath sounds normal.  Abdominal: Soft. Bowel sounds are normal. She exhibits no distension. There is no tenderness.  Genitourinary:  No external lesion slight amount of white discharge in vault. Os closed no cervical motion tenderness. Mild tenderness at uterine fundus  Musculoskeletal: Normal range of motion. She exhibits no edema or tenderness.  Neurological: She is alert. Coordination normal.  Skin: Skin is warm and dry. No rash noted.  Psychiatric: She has a normal mood and affect.  Nursing note and vitals reviewed.   ED Course  Procedures (including critical care time) Labs Review Labs Reviewed - No data to  display  Imaging Review No results found. I have personally reviewed and evaluated these images and lab results as part of my medical decision-making.   EKG Interpretation None     Patient declined HIV test since he has had one recently 10:30 AM patient asymptomatic after treatment with Tylenol Results for orders placed or performed during the hospital encounter of 03/24/15  Wet prep, genital  Result Value Ref Range   Yeast Wet Prep HPF POC NONE SEEN NONE SEEN   Trich, Wet Prep NONE SEEN NONE SEEN   Clue Cells Wet Prep HPF POC NONE SEEN NONE SEEN   WBC, Wet Prep HPF POC FEW (A) NONE SEEN  hCG, quantitative, pregnancy  Result Value Ref Range   hCG, Beta Chain, Quant, S 43870 (H) <5 mIU/mL   US Ob Comp Less 14 Wks  03/24/2015   CLINICAL DATA:  Postcoital pelvic pain for 1 day. Pregnant patient with estimated date of delivery 10/27/2015  EXAM: OBSTETRIC <14 WK [place] AND TRANSVAGINAL OB US  TECHNIQUE: Both transabdominal and transvaginal ultrasound examinations were performed for complete evaluation of the gestation as well as the maternal uterus, adnexal regions, and pelvic cul-de-sac. Transvaginal technique was performed to assess early pregnancy.  COMPARISON:  03/06/2015  FINDINGS: Intrauterine gestational sac: Visualized/normal in shape.  Yolk sac:  Present.  Embryo:  Present  Cardiac Activity: Present  Heart Rate: 163  bpm  CRL: 18 mm corresponding to 8 w 2 d [place] EDC: 11/01/2015  Maternal uterus/adnexae: The right ovary has normal appearance. It contains a 3.6 x 2.7 x 3.0 cm likely physiologic cyst. The left ovary has normal appearance. Again seen in left parapelvic versus ovarian cyst measuring 2.6 x 1.5 x 2.3 cm.  IMPRESSION: Single live intrauterine pregnancy corresponding to 8 weeks and 2 days gestation.  Likely physiologic right ovarian cyst.  2.6 cm left parapelvic versus ovarian cyst. Attention on future sonographic follow-up is recommended.   Electronically Signed   By: Ted Mcalpine M.D.   On: 03/24/2015 10:05   US Ob Comp Less 14 Wks  03/06/2015   CLINICAL DATA:  Vaginal bleeding and hematuria. Quantitative beta HCG pending. Previous quantitative HCG 1,339 03/02/2015. Estimated gestational age [redacted] weeks 3 days per LMP.  EXAM: OBSTETRIC <14 WK [place] AND TRANSVAGINAL OB US  TECHNIQUE: Both transabdominal and transvaginal ultrasound examinations were performed for complete evaluation of the gestation as well as the maternal uterus, adnexal regions, and pelvic cul-de-sac. Transvaginal technique was performed to assess early pregnancy.  COMPARISON:  03/02/2015  FINDINGS: Intrauterine gestational sac: Visualized/normal in shape.  Yolk sac:  Visualized.  Embryo:  Visualized.  Cardiac Activity: Not visualized.  Heart Rate: Not visualized.  CRL:  2  mm   5 w   5 d                  [place] EDC: 11/01/2015  No evidence of subchorionic hemorrhage.  Maternal uterus/adnexae: Ovaries are within normal there is a 3 cm simple right ovarian cyst likely corpus luteal cyst. There is a simple 2.6 cm cyst within or adjacent to the left ovary. No free pelvic fluid.  Transabdominal images demonstrate the focal somewhat lobular echogenic area along the dependent portion of the bladder just left of midline likely representing hemorrhagic debris/clot and less likely a mass.  IMPRESSION: Intrauterine gestational sac with yolk sac and embryo. Estimated gestational age [redacted] weeks 5 days. No cardiac activity demonstrated at this time. This likely represents a normal early intrauterine pregnancy as recommend correlation with serial quantitative HCG and follow-up ultrasound in 10- 14 days.  Lobular echogenic focus along the left-sided the deep and portion of the bladder likely hemorrhagic debris/ clot in this patient with hematuria and less likely mass.   Electronically Signed   By: Elberta Fortis M.D.   On: 03/06/2015 18:31   US Ob Comp Less 14 Wks  03/02/2015   ADDENDUM REPORT: 03/02/2015 21:35  ADDENDUM: Impression  correction:  Intrauterine pregnancy with gestational sac and yolk sac. No fetal pole.  Estimated gestational age by mean sac diameter equals 4 weeks 6 days  Findings conveyed toHeather sonographeron 03/02/2015  at21:34.   Electronically Signed   By: Genevive Bi M.D.   On: 03/02/2015 21:35   03/02/2015   CLINICAL DATA:  Lower abdominal pain for 1 month  EXAM: OBSTETRIC <14 WK [place] AND TRANSVAGINAL OB US  TECHNIQUE: Both transabdominal and transvaginal ultrasound examinations were performed for complete evaluation of the gestation as well as the maternal uterus, adnexal regions, and pelvic cul-de-sac. Transvaginal technique was performed to assess early pregnancy.  COMPARISON:  None.  FINDINGS: Intrauterine gestational sac: Single  Yolk sac:  Present  Embryo:  Not identified  Cardiac Activity: Not identified  MSD: 3.8  mm   4 w   6  d  Maternal uterus/adnexae: Small subchorionic hemorrhage. Functional ovarian cyst of the RIGHT ovary normal LEFT ovary. No free fluid.  IMPRESSION: Probable early intrauterine gestational sac, but no yolk sac, fetal pole, or cardiac activity yet visualized. Recommend follow-up quantitative B-HCG levels and follow-up US in 14 days to confirm and assess viability. This recommendation follows SRU consensus guidelines: Diagnostic Criteria for Nonviable Pregnancy Early in the First Trimester. Malva Limes Med 2013; 161:0960-45.  Electronically Signed: By: Genevive Bi M.D. On: 03/02/2015 20:39   US Ob Transvaginal  03/24/2015   CLINICAL DATA:  Postcoital pelvic pain for 1 day. Pregnant patient with estimated date of delivery 10/27/2015  EXAM: OBSTETRIC <14 WK [place] AND TRANSVAGINAL OB US  TECHNIQUE: Both transabdominal and transvaginal ultrasound examinations were performed for complete evaluation of the gestation as well as the maternal uterus, adnexal regions, and pelvic cul-de-sac. Transvaginal technique was performed to assess early pregnancy.  COMPARISON:  03/06/2015  FINDINGS:  Intrauterine gestational sac: Visualized/normal in shape.  Yolk sac:  Present.  Embryo:  Present  Cardiac Activity: Present  Heart Rate: 163  bpm  CRL: 18 mm corresponding to 8 w 2 d [place] EDC: 11/01/2015  Maternal uterus/adnexae: The right ovary has normal appearance. It contains a 3.6 x 2.7 x 3.0 cm likely physiologic cyst. The left ovary has normal appearance. Again seen in left parapelvic versus ovarian cyst measuring 2.6 x 1.5 x 2.3 cm.  IMPRESSION: Single live intrauterine pregnancy corresponding to 8 weeks and 2 days gestation.  Likely physiologic right ovarian cyst.  2.6 cm left parapelvic versus ovarian cyst. Attention on future sonographic follow-up is recommended.   Electronically Signed   By: Ted Mcalpine M.D.   On: 03/24/2015 10:05   US Ob Transvaginal  03/06/2015   CLINICAL DATA:  Vaginal bleeding and hematuria. Quantitative beta HCG  pending. Previous quantitative HCG 1,339 03/02/2015. Estimated gestational age [redacted] weeks 3 days per LMP.  EXAM: OBSTETRIC <14 WK [place] AND TRANSVAGINAL OB US  TECHNIQUE: Both transabdominal and transvaginal ultrasound examinations were performed for complete evaluation of the gestation as well as the maternal uterus, adnexal regions, and pelvic cul-de-sac. Transvaginal technique was performed to assess early pregnancy.  COMPARISON:  03/02/2015  FINDINGS: Intrauterine gestational sac: Visualized/normal in shape.  Yolk sac:  Visualized.  Embryo:  Visualized.  Cardiac Activity: Not visualized.  Heart Rate: Not visualized.  CRL:  2  mm   5 w   5 d                  [place] EDC: 11/01/2015  No evidence of subchorionic hemorrhage.  Maternal uterus/adnexae: Ovaries are within normal there is a 3 cm simple right ovarian cyst likely corpus luteal cyst. There is a simple 2.6 cm cyst within or adjacent to the left ovary. No free pelvic fluid.  Transabdominal images demonstrate the focal somewhat lobular echogenic area along the dependent portion of the bladder just left of midline likely  representing hemorrhagic debris/clot and less likely a mass.  IMPRESSION: Intrauterine gestational sac with yolk sac and embryo. Estimated gestational age [redacted] weeks 5 days. No cardiac activity demonstrated at this time. This likely represents a normal early intrauterine pregnancy as recommend correlation with serial quantitative HCG and follow-up ultrasound in 10- 14 days.  Lobular echogenic focus along the left-sided the deep and portion of the bladder likely hemorrhagic debris/ clot in this patient with hematuria and less likely mass.   Electronically Signed   By: Elberta Fortis M.D.   On: 03/06/2015 18:31   US Ob Transvaginal  03/02/2015   ADDENDUM REPORT: 03/02/2015 21:35  ADDENDUM: Impression correction:  Intrauterine pregnancy with gestational sac and yolk sac. No fetal pole.  Estimated gestational age by mean sac diameter equals 4 weeks 6 days  Findings conveyed toHeather sonographeron 03/02/2015  at21:34.   Electronically Signed   By: Genevive Bi M.D.   On: 03/02/2015 21:35   03/02/2015   CLINICAL DATA:  Lower abdominal pain for 1 month  EXAM: OBSTETRIC <14 WK [place] AND TRANSVAGINAL OB US  TECHNIQUE: Both transabdominal and transvaginal ultrasound examinations were performed for complete evaluation of the gestation as well as the maternal uterus, adnexal regions, and pelvic cul-de-sac. Transvaginal technique was performed to assess early pregnancy.  COMPARISON:  None.  FINDINGS: Intrauterine gestational sac: Single  Yolk sac:  Present  Embryo:  Not identified  Cardiac Activity: Not identified  MSD: 3.8  mm   4 w   6  d  Maternal uterus/adnexae: Small subchorionic hemorrhage. Functional ovarian cyst of the RIGHT ovary normal LEFT ovary. No free fluid.  IMPRESSION: Probable early intrauterine gestational sac, but no yolk sac, fetal pole, or cardiac activity yet visualized. Recommend follow-up quantitative B-HCG levels and follow-up US in 14 days to confirm and assess viability. This recommendation follows  SRU consensus guidelines: Diagnostic Criteria for Nonviable Pregnancy Early in the First Trimester. Malva Limes Med 2013; 109:6045-40.  Electronically Signed: By: Genevive Bi M.D. On: 03/02/2015 20:39    MDM  Urine sent for culture as she did not get urine culture at last visit. She was treated with nitrofurantoin for UTI. Final diagnoses:  None   Plan keep scheduled appointment with Dr. Gaynell Face 04/04/2015. Continue with prenatal vitamins Diagnoses #1 suprapubic pain #2 intrauterine pregnancy     Doug Sou, MD 03/24/15 1624

## 2015-03-24 NOTE — Discharge Instructions (Signed)
Abdominal Pain During Pregnancy It is safe to take Tylenol as directed during pregnancy. Continue to take  vitamins as directed. Keep your scheduled appointment with Dr. Gaynell Face on 04/04/2015 Belly (abdominal) pain is common during pregnancy. Most of the time, it is not a serious problem. Other times, it can be a sign that something is wrong with the pregnancy. Always tell your doctor if you have belly pain. HOME CARE Monitor your belly pain for any changes. The following actions may help you feel better:  Do not have sex (intercourse) or put anything in your vagina until you feel better.  Rest until your pain stops.  Drink clear fluids if you feel sick to your stomach (nauseous). Do not eat solid food until you feel better.  Only take medicine as told by your doctor.  Keep all doctor visits as told. GET HELP RIGHT AWAY IF:   You are bleeding, leaking fluid, or pieces of tissue come out of your vagina.  You have more pain or cramping.  You keep throwing up (vomiting).  You have pain when you pee (urinate) or have blood in your pee.  You have a fever.  You do not feel your baby moving as much.  You feel very weak or feel like passing out.  You have trouble breathing, with or without belly pain.  You have a very bad headache and belly pain.  You have fluid leaking from your vagina and belly pain.  You keep having watery poop (diarrhea).  Your belly pain does not go away after resting, or the pain gets worse. MAKE SURE YOU:   Understand these instructions.  Will watch your condition.  Will get help right away if you are not doing well or get worse. Document Released: 06/13/2009 Document Revised: 02/25/2013 Document Reviewed: 01/22/2013 [facility] Patient Information 2015 [place], [place]. This information is not intended to replace advice given to you by your health care provider. Make sure you discuss any questions you have with your health care provider.

## 2015-03-25 LAB — GC/CHLAMYDIA PROBE AMP (~~LOC~~) NOT AT ARMC
CHLAMYDIA, DNA PROBE: NEGATIVE
NEISSERIA GONORRHEA: NEGATIVE

## 2015-03-27 LAB — URINE CULTURE: SPECIAL REQUESTS: NORMAL

## 2015-03-28 ENCOUNTER — Telehealth (HOSPITAL_COMMUNITY): Payer: Self-pay

## 2015-03-28 NOTE — Telephone Encounter (Signed)
Post ED Visit - Positive Culture Follow-up: Chart Hand-off to ED Flow Manager  Culture assessed and recommendations reviewed by:  Isaac Bliss, Pharm.D., BCPS  Celedonio Miyamoto, Pharm.D., BCPS-AQ ID  Georgina Pillion, Pharm.D., BCPS  [place], [address].D., BCPS, AAHIVP  Estella Husk, Pharm .D., BCPS, AAHIVP  Cassie Stewart, Pharm.D.  Casilda Carls, Pharm.D.  Positive urine culture   Patient discharged without antimicrobial prescription and treatment is now indicated  Organism is resistant to prescribed ED discharge antimicrobial  Patient with positive blood cultures  Changes discussed with ED Kania Regnier: Marlon Pel PA New antibiotic prescription macrobid  po bid x 5 days.   Spoke with pt. States she has followed up with md at an ucc and is currently  Being treated  With antiboitics. Does not know the name of the medication.    Ashley Jacobs 03/28/2015, 1:17 PM

## 2015-03-28 NOTE — Progress Notes (Signed)
ED Antimicrobial Stewardship Positive Culture Follow Up   Molly Bowers is an 24 y.o. female who presented to [facility] on 03/24/2015 with a chief complaint of  Chief Complaint  Patient presents with  . Abdominal Cramping    2 months pregnant    Recent Results (from the past 720 hour(s))  Culture, OB Urine     Status: None   Collection Time: 03/02/15  5:30 PM  Result Value Ref Range Status   Specimen Description URINE, CLEAN CATCH  Final   Special Requests NONE  Final   Culture   Final    >=100,000 COLONIES/mL ESCHERICHIA COLI Performed at [facility]    Report Status 03/04/2015 FINAL  Final   Organism ID, Bacteria ESCHERICHIA COLI  Final      Susceptibility   Escherichia coli - MIC*    AMPICILLIN <=2 SENSITIVE Sensitive     CEFAZOLIN <=4 SENSITIVE Sensitive     CEFTRIAXONE <=1 SENSITIVE Sensitive     CIPROFLOXACIN <=0.25 SENSITIVE Sensitive     GENTAMICIN 2 SENSITIVE Sensitive     IMIPENEM <=0.25 SENSITIVE Sensitive     NITROFURANTOIN <=16 SENSITIVE Sensitive     TRIMETH/SULFA <=20 SENSITIVE Sensitive     AMPICILLIN/SULBACTAM <=2 SENSITIVE Sensitive     PIP/TAZO <=4 SENSITIVE Sensitive     * >=100,000 COLONIES/mL ESCHERICHIA COLI  Wet prep, genital     Status: Abnormal   Collection Time: 03/06/15  2:13 PM  Result Value Ref Range Status   Yeast Wet Prep HPF POC NONE SEEN NONE SEEN Final   Trich, Wet Prep NONE SEEN NONE SEEN Final   Clue Cells Wet Prep HPF POC FEW (A) NONE SEEN Final   WBC, Wet Prep HPF POC FEW (A) NONE SEEN Final  Urine culture     Status: None   Collection Time: 03/24/15  8:34 AM  Result Value Ref Range Status   Specimen Description URINE, CLEAN CATCH  Final   Special Requests Normal  Final   Culture   Final    >=100,000 COLONIES/mL STAPHYLOCOCCUS SPECIES (COAGULASE NEGATIVE) Performed at [facility]    Report Status 03/27/2015 FINAL  Final   Organism ID, Bacteria STAPHYLOCOCCUS SPECIES (COAGULASE NEGATIVE)  Final   Susceptibility   Staphylococcus species (coagulase negative) - MIC*    CIPROFLOXACIN <=0.5 SENSITIVE Sensitive     GENTAMICIN <=0.5 SENSITIVE Sensitive     NITROFURANTOIN <=16 SENSITIVE Sensitive     OXACILLIN <=0.25 SENSITIVE Sensitive     TETRACYCLINE <=1 SENSITIVE Sensitive     VANCOMYCIN 2 SENSITIVE Sensitive     TRIMETH/SULFA <=10 SENSITIVE Sensitive     CLINDAMYCIN <=0.25 RESISTANT Resistant     RIFAMPIN <=0.5 SENSITIVE Sensitive     Inducible Clindamycin POSITIVE Resistant     * >=100,000 COLONIES/mL STAPHYLOCOCCUS SPECIES (COAGULASE NEGATIVE)  Wet prep, genital     Status: Abnormal   Collection Time: 03/24/15  9:01 AM  Result Value Ref Range Status   Yeast Wet Prep HPF POC NONE SEEN NONE SEEN Final   Trich, Wet Prep NONE SEEN NONE SEEN Final   Clue Cells Wet Prep HPF POC NONE SEEN NONE SEEN Final   WBC, Wet Prep HPF POC FEW (A) NONE SEEN Final      Patient discharged originally without antimicrobial agent and treatment is now indicated  New antibiotic prescription: Macrobid  po BID x 5 days  ED Provider: Marlon Pel, PA-C   Mickeal Skinner 03/28/2015, 11:36 AM Infectious Diseases Pharmacist Phone# [phone]

## 2015-04-04 LAB — OB RESULTS CONSOLE RUBELLA ANTIBODY, IGM: Rubella: NON-IMMUNE/NOT IMMUNE

## 2015-04-04 LAB — OB RESULTS CONSOLE ABO/RH: RH TYPE: NEGATIVE

## 2015-04-04 LAB — OB RESULTS CONSOLE HEPATITIS B SURFACE ANTIGEN: Hepatitis B Surface Ag: NEGATIVE

## 2015-04-04 LAB — OB RESULTS CONSOLE ANTIBODY SCREEN: ANTIBODY SCREEN: POSITIVE

## 2015-04-04 LAB — OB RESULTS CONSOLE HIV ANTIBODY (ROUTINE TESTING): HIV: NONREACTIVE

## 2015-04-04 LAB — OB RESULTS CONSOLE RPR: RPR: NONREACTIVE

## 2015-04-04 LAB — OB RESULTS CONSOLE GC/CHLAMYDIA
Chlamydia: NEGATIVE
Gonorrhea: NEGATIVE

## 2015-04-04 LAB — PROCEDURE REPORT - SCANNED: Pap: NEGATIVE

## 2015-04-24 ENCOUNTER — Telehealth: Payer: Self-pay | Admitting: Advanced Practice Midwife

## 2015-04-24 ENCOUNTER — Inpatient Hospital Stay (HOSPITAL_COMMUNITY)
Admission: AD | Admit: 2015-04-24 | Discharge: 2015-04-24 | Disposition: A | Discharge status: Home / Self Care | Payer: Medicaid Other | Source: Ambulatory Visit | Attending: Obstetrics | Admitting: Obstetrics

## 2015-04-24 ENCOUNTER — Encounter (HOSPITAL_COMMUNITY): Payer: Self-pay | Admitting: *Deleted

## 2015-04-24 DIAGNOSIS — O209 Hemorrhage in early pregnancy, unspecified: Secondary | ICD-10-CM | POA: Insufficient documentation

## 2015-04-24 DIAGNOSIS — Z880 Allergy status to penicillin: Secondary | ICD-10-CM | POA: Diagnosis not present

## 2015-04-24 DIAGNOSIS — O2341 Unspecified infection of urinary tract in pregnancy, first trimester: Secondary | ICD-10-CM | POA: Insufficient documentation

## 2015-04-24 DIAGNOSIS — Z3A13 13 weeks gestation of pregnancy: Secondary | ICD-10-CM | POA: Diagnosis not present

## 2015-04-24 DIAGNOSIS — Z87891 Personal history of nicotine dependence: Secondary | ICD-10-CM | POA: Diagnosis not present

## 2015-04-24 DIAGNOSIS — O200 Threatened abortion: Secondary | ICD-10-CM | POA: Diagnosis present

## 2015-04-24 DIAGNOSIS — O2342 Unspecified infection of urinary tract in pregnancy, second trimester: Secondary | ICD-10-CM

## 2015-04-24 DIAGNOSIS — O4692 Antepartum hemorrhage, unspecified, second trimester: Secondary | ICD-10-CM

## 2015-04-24 LAB — URINALYSIS, ROUTINE W REFLEX MICROSCOPIC
Bilirubin Urine: NEGATIVE
GLUCOSE, UA: NEGATIVE mg/dL
Ketones, ur: NEGATIVE mg/dL
LEUKOCYTES UA: NEGATIVE
Nitrite: POSITIVE — AB
PH: 6 (ref 5.0–8.0)
Protein, ur: NEGATIVE mg/dL
Urobilinogen, UA: 1 mg/dL (ref 0.0–1.0)

## 2015-04-24 LAB — WET PREP, GENITAL
Trich, Wet Prep: NONE SEEN
Yeast Wet Prep HPF POC: NONE SEEN

## 2015-04-24 LAB — URINE MICROSCOPIC-ADD ON

## 2015-04-24 MED ORDER — SULFAMETHOXAZOLE-TRIMETHOPRIM 800-160 MG PO TABS
1.0000 | ORAL_TABLET | Freq: Two times a day (BID) | ORAL | Status: DC
Start: 2015-04-24 — End: 2015-05-13

## 2015-04-24 NOTE — Telephone Encounter (Signed)
UA resulted after pt left. Shows UTI. Notified pt. She had been TXed for UTI in August w/ Macrobid and states she took it as directed. Does not want to take it again because it bothered her stomach. Will Rx Bactrim DS.

## 2015-04-24 NOTE — MAU Note (Signed)
Patient presents at [redacted] weeks gestation with c/o noting discharge and blood on toilet tissue when wiping after voiding. Denies pain.

## 2015-04-24 NOTE — Discharge Instructions (Signed)
Vaginal Bleeding During Pregnancy, Second Trimester A small amount of bleeding (spotting) from the vagina is relatively common in pregnancy. It usually stops on its own. Various things can cause bleeding or spotting in pregnancy. Some bleeding may be related to the pregnancy, and some may not. Sometimes the bleeding is normal and is not a problem. However, bleeding can also be a sign of something serious. Be sure to tell your health care provider about any vaginal bleeding right away. Some possible causes of vaginal bleeding during the second trimester include:  Infection, inflammation, or growths on the cervix.   The placenta may be partially or completely covering the opening of the cervix inside the uterus (placenta previa).  The placenta may have separated from the uterus (abruption of the placenta).   You may be having early (preterm) labor.   The cervix may not be strong enough to keep a baby inside the uterus (cervical insufficiency).   Tiny cysts may have developed in the uterus instead of pregnancy tissue (molar pregnancy). HOME CARE INSTRUCTIONS  Watch your condition for any changes. The following actions may help to lessen any discomfort you are feeling:  Follow your health care provider's instructions for limiting your activity. If your health care provider orders bed rest, you may need to stay in bed and only get up to use the bathroom. However, your health care provider may allow you to continue light activity.  If needed, make plans for someone to help with your regular activities and responsibilities while you are on bed rest.  Keep track of the number of pads you use each day, how often you change pads, and how soaked (saturated) they are. Write this down.  Do not use tampons. Do not douche.  Do not have sexual intercourse or orgasms until approved by your health care provider.  If you pass any tissue from your vagina, save the tissue so you can show it to your  health care provider.  Only take over-the-counter or prescription medicines as directed by your health care provider.  Do not take aspirin because it can make you bleed.  Do not exercise or perform any strenuous activities or heavy lifting without your health care provider's permission.  Keep all follow-up appointments as directed by your health care provider. SEEK MEDICAL CARE IF:  You have any vaginal bleeding during any part of your pregnancy.  You have cramps or labor pains.  You have a fever, not controlled by medicine. SEEK IMMEDIATE MEDICAL CARE IF:   You have severe cramps in your back or belly (abdomen).  You have contractions.  You have chills.  You pass large clots or tissue from your vagina.  Your bleeding increases.  You feel light-headed or weak, or you have fainting episodes.  You are leaking fluid or have a gush of fluid from your vagina. MAKE SURE YOU:  Understand these instructions.  Will watch your condition.  Will get help right away if you are not doing well or get worse.   This information is not intended to replace advice given to you by your health care provider. Make sure you discuss any questions you have with your health care provider.   Document Released: 04/04/2005 Document Revised: 06/30/2013 Document Reviewed: 03/02/2013 Elsevier Interactive Patient Education 2016 Elsevier Inc.   Pelvic Rest Pelvic rest is sometimes recommended for women when:   The placenta is partially or completely covering the opening of the cervix (placenta previa).  There is bleeding between the uterine wall  and the amniotic sac in the first trimester (subchorionic hemorrhage).  The cervix begins to open without labor starting (incompetent cervix, cervical insufficiency).  The labor is too early (preterm labor). HOME CARE INSTRUCTIONS  Do not have sexual intercourse, stimulation, or an orgasm.  Do not use tampons, douche, or put anything in the  vagina.  Do not lift anything over 10 pounds (4.5 kg).  Avoid strenuous activity or straining your pelvic muscles. SEEK MEDICAL CARE IF:  You have any vaginal bleeding during pregnancy. Treat this as a potential emergency.  You have cramping pain felt low in the stomach (stronger than menstrual cramps).  You notice vaginal discharge (watery, mucus, or bloody).  You have a low, dull backache.  There are regular contractions or uterine tightening. SEEK IMMEDIATE MEDICAL CARE IF: You have vaginal bleeding and have placenta previa.    This information is not intended to replace advice given to you by your health care provider. Make sure you discuss any questions you have with your health care provider.   Document Released: 10/20/2010 Document Revised: 09/17/2011 Document Reviewed: 12/27/2014 Elsevier Interactive Patient Education [employer]2016 Elsevier Inc.

## 2015-04-24 NOTE — MAU Provider Note (Signed)
Chief Complaint: Threatened Miscarriage   First Provider Initiated Contact with Patient 04/24/15 1208     SUBJECTIVE HPI: Molly Bowers is a 24 y.o. G2P1001 at [redacted]w[redacted]d who presents to M[facility] Admissions reporting seeing small amount of bloody mucus w/ wiping. Had pelvic and transvaginal ultrasound performed 03/02/15 showing small [facility] and normal [place] 03/24/2015. Denies pain. No recent intercourse.   Blood type B neg. Receive Rhophylac 02/26/2015.  Past Medical History  Diagnosis Date  . Medical history non-contributory    OB History  Gravida Para Term Preterm AB SAB TAB Ectopic Multiple Living  0 0 0 0 0 0 1    # Outcome Date GA Lbr Len/2nd Weight Sex Delivery Anes PTL Lv  2 Current           1 Term 11/12/10    M Vag-Spont EPI N Y     Past Surgical History  Procedure Laterality Date  . Keloid excision     Social History   Social History  . Marital Status: Single    Spouse Name: N/A  . Number of Children: N/A  . Years of Education: N/A   Occupational History  . Not on file.   Social History Main Topics  . Smoking status: Former Smoker    Types: Cigarettes    Quit date: 12/24/2014  . Smokeless tobacco: Not on file  . Alcohol Use: Yes     Comment: denies 03/24/2015  . Drug Use: No  . Sexual Activity: Yes    Birth Control/ Protection: None   Other Topics Concern  . Not on file   Social History Narrative   No current facility-administered medications on file prior to encounter.   Current Outpatient Prescriptions on File Prior to Encounter  Medication Sig Dispense Refill  . EPINEPHrine (EPIPEN 2-PAK) 0.3 mg/0.3 mL IJ SOAJ injection Inject 0.3 mg into the muscle once.    . metroNIDAZOLE (FLAGYL) 500 MG tablet Take 1 tablet (500 mg total) by mouth 2 (two) times daily. One po bid x 7 days (Patient not taking: Reported on 03/24/2015) 14 tablet 0  . nitrofurantoin, macrocrystal-monohydrate, (MACROBID) 100 MG capsule Take 1 capsule (100 mg total) by mouth 2 (two)  times daily. (Patient not taking: Reported on 03/06/2015) 14 capsule 0  . nitrofurantoin, macrocrystal-monohydrate, (MACROBID) 100 MG capsule Take 1 capsule (100 mg total) by mouth 2 (two) times daily. X 7 days (Patient not taking: Reported on 03/24/2015) 14 capsule 0   Allergies  Allergen Reactions  . Peanut-Containing Drug Products Anaphylaxis  . Vancomycin Itching  . Penicillins Hives and Rash    I have reviewed the past Medical Hx, Surgical Hx, Social Hx, Allergies and Medications.   Review of Systems  Gastrointestinal: Negative for abdominal pain and blood in stool.  Genitourinary: Positive for vaginal bleeding. Negative for dysuria, urgency, frequency, hematuria, vaginal discharge, genital sores and pelvic pain.  Musculoskeletal: Negative for back pain.    OBJECTIVE Patient Vitals for the past 24 hrs:  BP Temp Temp src Pulse Resp Height Weight  04/24/15 1125 111/55 mmHg 98.3 F (36.8 C) Oral 79 16  (1.676 m) 234 lb 2 oz (106.198 kg)   Constitutional: Well-developed, well-nourished female in no acute distress.  Cardiovascular: normal rate Respiratory: normal rate and effort.  GI: Abd soft, non-tender, gravid appropriate for gestational age. Neurologic: Alert and oriented x 4.  GU: Neg CVAT.  SPECULUM EXAM: NEFG, small amount of brown mucus noted coming through os, cervix clean  BIMANUAL:  cervix long and closed; uterus 14-week size, no adnexal tenderness or masses. No CMT.  Fetal heart rate 155 by Doppler.  LAB RESULTS Results for orders placed or performed during the hospital encounter of 04/24/15 (from the past 24 hour(s))  Urinalysis, Routine w reflex microscopic (not at [facility])     Status: Abnormal   Collection Time: 04/24/15 11:31 AM  Result Value Ref Range   Color, Urine YELLOW YELLOW   APPearance HAZY (A) CLEAR   Specific Gravity, Urine >1.030 (H) 1.005 - 1.030   pH 6.0 5.0 - 8.0   Glucose, UA NEGATIVE NEGATIVE mg/dL   Hgb urine dipstick LARGE (A) NEGATIVE    Bilirubin Urine NEGATIVE NEGATIVE   Ketones, ur NEGATIVE NEGATIVE mg/dL   Protein, ur NEGATIVE NEGATIVE mg/dL   Urobilinogen, UA 1.0 0.0 - 1.0 mg/dL   Nitrite POSITIVE (A) NEGATIVE   Leukocytes, UA NEGATIVE NEGATIVE  Urine microscopic-add on     Status: Abnormal   Collection Time: 04/24/15 11:31 AM  Result Value Ref Range   Squamous Epithelial / LPF MANY (A) RARE   WBC, UA 7-10 <3 WBC/hpf   RBC / HPF 0-2 <3 RBC/hpf   Bacteria, UA FEW (A) RARE   Urine-Other MUCOUS PRESENT   Wet prep, genital     Status: Abnormal   Collection Time: 04/24/15 12:15 PM  Result Value Ref Range   Yeast Wet Prep HPF POC NONE SEEN NONE SEEN   Trich, Wet Prep NONE SEEN NONE SEEN   Clue Cells Wet Prep HPF POC FEW (A) NONE SEEN   WBC, Wet Prep HPF POC FEW (A) NONE SEEN    IMAGING No results found.  MAU COURSE Pelvic exam, Wet prep, GC/Chlamydia. Dr. Gaynell FaceMarshall notified of bleeding, exam. No new orders.  MDM 24 year-old female at 13.3 weeks w/ scant VB, closed cervix and essentially normal wet prep. No evidence of SAB in progress.   ASSESSMENT 1. Antepartum bleeding, second trimester    PLAN Discharge home in stable condition per consult w/ Dr. Gaynell FaceMarshall. Bleeding Precautions UA resulted after pt left. Shows UTI. Will call pt and Rx ABX.   Follow-up Information    Schedule an appointment as soon as possible for a visit with Kathreen CosierMARSHALL,BERNARD A, MD.   Specialty:  Obstetrics and Gynecology   Contact information:   [address] [place] [address] [phone]       Follow up with [facility].   Why:  As needed in emergencies   Contact information:   [address] [id number] mc [place] [id number] [phone]       Medication List    STOP taking these medications        metroNIDAZOLE 500 MG tablet  Commonly known as:  FLAGYL     nitrofurantoin (macrocrystal-monohydrate) 100 MG capsule  Commonly known as:   MACROBID      TAKE these medications        EPIPEN 2-PAK 0.3 mg/0.3 mL Soaj injection  Generic drug:  EPINEPHrine  Inject 0.3 mg into the muscle once.       [place] Ambriella Kitt, CNM 04/24/2015  12:34 PM

## 2015-04-25 LAB — GC/CHLAMYDIA PROBE AMP (~~LOC~~) NOT AT ARMC
Chlamydia: NEGATIVE
Neisseria Gonorrhea: NEGATIVE

## 2015-04-27 ENCOUNTER — Encounter (HOSPITAL_COMMUNITY): Payer: Self-pay | Admitting: *Deleted

## 2015-04-27 ENCOUNTER — Emergency Department (HOSPITAL_COMMUNITY)
Admission: EM | Admit: 2015-04-27 | Discharge: 2015-04-27 | Disposition: A | Discharge status: Home / Self Care | Payer: Medicaid Other | Attending: Emergency Medicine | Admitting: Emergency Medicine

## 2015-04-27 DIAGNOSIS — J029 Acute pharyngitis, unspecified: Secondary | ICD-10-CM | POA: Insufficient documentation

## 2015-04-27 DIAGNOSIS — Z87891 Personal history of nicotine dependence: Secondary | ICD-10-CM | POA: Diagnosis not present

## 2015-04-27 DIAGNOSIS — Z79899 Other long term (current) drug therapy: Secondary | ICD-10-CM | POA: Insufficient documentation

## 2015-04-27 DIAGNOSIS — R0982 Postnasal drip: Secondary | ICD-10-CM | POA: Diagnosis not present

## 2015-04-27 DIAGNOSIS — Z880 Allergy status to penicillin: Secondary | ICD-10-CM | POA: Diagnosis not present

## 2015-04-27 MED ORDER — LIDOCAINE VISCOUS 2 % MT SOLN
15.0000 mL | Freq: Once | OROMUCOSAL | Status: AC
  Administered 2015-04-27: 15 mL via OROMUCOSAL
  Filled 2015-04-27: qty 15

## 2015-04-27 MED ORDER — ACETAMINOPHEN 325 MG PO TABS
325.0000 mg | ORAL_TABLET | Freq: Once | ORAL | Status: AC
  Administered 2015-04-27: 325 mg via ORAL
  Filled 2015-04-27: qty 1

## 2015-04-27 MED ORDER — LORATADINE 10 MG PO TABS: 10.0000 mg | ORAL_TABLET | Freq: Every day | ORAL | Status: DC

## 2015-04-27 NOTE — ED Notes (Signed)
Patient is alert and orientedx4.  Patient was explained discharge instructions and they understood them with no questions.   

## 2015-04-27 NOTE — Discharge Instructions (Signed)

## 2015-04-27 NOTE — ED Notes (Signed)
Pt reports sore throat x2 weeks, pt states she had a negative mono and strep test - pt concerned she has pharyngitis. No recent fever.

## 2015-04-27 NOTE — ED Provider Notes (Signed)
CSN: [id number]     Arrival date & time 04/27/15  2021 History  By signing my name below, I, Tanda RockersMargaux Venter, attest that this documentation has been prepared under the direction and in the presence of Mohawk IndustriesJeff Sahib Pella, PA-C. Electronically Signed: Tanda RockersMargaux Venter, ED Scribe. 04/27/2015. 9:26 PM.    Chief Complaint  Patient presents with  . Sore Throat   The history is provided by the patient. No language interpreter was used.     HPI Comments: Molly Bowers is a 24 y.o. female who presents to the Emergency Department complaining of gradual onset, constant, sore throat x 2 weeks. Pt states she was seen in the ED recently for similar symptoms and was prescribed antibiotics with relief. Pt reports that the sore throat came back 2-3 days after finishing the antibiotics. She cannot say which antibiotic she was taking. She also complains of pain with swallowing, post nasal drip, and cough at night. Able to tolerate secretions well.  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 She denies chills, nasal congestion, or any other associated symptoms. No change in sleeping environment recently. Pt is currently [redacted] weeks pregnant.   Past Medical History  Diagnosis Date  . Medical history non-contributory    Past Surgical History  Procedure Laterality Date  . Keloid excision     History reviewed. No pertinent family history. Social History  Substance Use Topics  . Smoking status: Former Smoker    Types: Cigarettes    Quit date: 12/24/2014  . Smokeless tobacco: None  . Alcohol Use: Yes     Comment: denies 03/24/2015   OB History    Gravida Para Term Preterm AB TAB SAB Ectopic Multiple Living   2 1 1  0 0 0 0 0 0 1     Review of Systems  A complete 10 system review of systems was obtained and all systems are negative except as noted in the HPI and PMH.    Allergies  Peanut-containing drug products; Vancomycin; and Penicillins  Home Medications   Prior  to Admission medications   Medication Sig Start Date End Date Taking? Authorizing Provider  EPINEPHrine (EPIPEN 2-PAK) 0.3 mg/0.3 mL IJ SOAJ injection Inject 0.3 mg into the muscle once.   Yes Historical Provider, MD  Prenatal Vit-Fe Fumarate-FA (PRENATAL MULTIVITAMIN) TABS tablet Take 1 tablet by mouth daily at 12 noon.   Yes Historical Provider, MD  loratadine (CLARITIN) 10 MG tablet Take 1 tablet (10 mg total) by mouth daily. 04/27/15   Eyvonne MechanicJeffrey Lashandra Arauz, PA-C  sulfamethoxazole-trimethoprim (BACTRIM DS,SEPTRA DS) 800-160 MG tablet Take 1 tablet by mouth 2 (two) times daily. Patient not taking: Reported on 04/27/2015 04/24/15   Dorathy KinsmanVirginia Smith, CNM   Triage Vitals: BP 118/43 mmHg  Pulse 89  Temp(Src) 99.3 F (37.4 C) (Oral)  Resp 16  Ht 5\' 5"  (1.651 m)  Wt 234 lb (106.142 kg)  BMI 38.94 kg/m2  SpO2 100%  LMP 01/20/2015   Physical Exam  Constitutional: She is oriented to person, place, and time. She appears well-developed and well-nourished. No distress.  HENT:  Head: Normocephalic and atraumatic.  Mouth/Throat: Oropharynx is clear and moist. No oropharyngeal exudate, posterior oropharyngeal edema or posterior oropharyngeal erythema.  Post nasal drainage in the throat  Eyes: Conjunctivae and EOM are normal.  Neck: Neck supple. No tracheal deviation present.  Cardiovascular: Normal rate.   Pulmonary/Chest: Effort normal. No respiratory distress.  Musculoskeletal: Normal range of motion.  Neurological: She  is alert and oriented to person, place, and time.  Skin: Skin is warm and dry.  Psychiatric: She has a normal mood and affect. Her behavior is normal.  Nursing note and vitals reviewed.   ED Course  Procedures (including critical care time)  DIAGNOSTIC STUDIES: Oxygen Saturation is 100% on RA, normal by my interpretation.    COORDINATION OF CARE: 9:15 PM-Discussed treatment plan which includes antihistamines and flonase with pt at bedside and pt agreed to plan.   Labs  Review Labs Reviewed - No data to display  Imaging Review No results found.   EKG Interpretation None      MDM   Final diagnoses:  Sore throat   Labs: None  Imaging: None  Consults: None  Therapeutics: None  Discharge Meds: Claritin, Flonase  Assessment/Plan: Patient's sore throat most likely represents postnasal drainage. She has no signs of acute infection on today's exam. Tolerating by mouth's without difficulty. She is afebrile, vital signs are reassuring. Patient will be prescribed Claritin and instructed follow-up with her primary care provider for reevaluation. Patient verbalizes understanding and agreement to today's plan, she was given strict verbal questions.       I personally performed the services described in this documentation, which was scribed in my presence. The recorded information has been reviewed and is accurate.      Eyvonne Mechanic, PA-C 04/27/15 2212  Eyvonne Mechanic, PA-C 04/27/15 [id number]  Bethann Berkshire, MD 04/27/15 [phone]

## 2015-05-13 ENCOUNTER — Encounter (HOSPITAL_COMMUNITY): Payer: Self-pay | Admitting: *Deleted

## 2015-05-13 ENCOUNTER — Inpatient Hospital Stay (EMERGENCY_DEPARTMENT_HOSPITAL)
Admission: AD | Admit: 2015-05-13 | Discharge: 2015-05-13 | Disposition: A | Discharge status: Home / Self Care | Payer: Medicaid Other | Source: Ambulatory Visit | Attending: Obstetrics | Admitting: Obstetrics

## 2015-05-13 ENCOUNTER — Emergency Department (HOSPITAL_COMMUNITY)
Admission: EM | Admit: 2015-05-13 | Discharge: 2015-05-13 | Discharge status: Against Medical Advice | Payer: Medicaid Other | Attending: Emergency Medicine | Admitting: Emergency Medicine

## 2015-05-13 ENCOUNTER — Encounter (HOSPITAL_COMMUNITY): Payer: Self-pay

## 2015-05-13 DIAGNOSIS — O23592 Infection of other part of genital tract in pregnancy, second trimester: Secondary | ICD-10-CM | POA: Diagnosis not present

## 2015-05-13 DIAGNOSIS — O9989 Other specified diseases and conditions complicating pregnancy, childbirth and the puerperium: Secondary | ICD-10-CM | POA: Diagnosis not present

## 2015-05-13 DIAGNOSIS — B9689 Other specified bacterial agents as the cause of diseases classified elsewhere: Secondary | ICD-10-CM

## 2015-05-13 DIAGNOSIS — N939 Abnormal uterine and vaginal bleeding, unspecified: Secondary | ICD-10-CM | POA: Insufficient documentation

## 2015-05-13 DIAGNOSIS — N760 Acute vaginitis: Secondary | ICD-10-CM

## 2015-05-13 DIAGNOSIS — R102 Pelvic and perineal pain: Secondary | ICD-10-CM | POA: Insufficient documentation

## 2015-05-13 DIAGNOSIS — A499 Bacterial infection, unspecified: Secondary | ICD-10-CM

## 2015-05-13 DIAGNOSIS — N949 Unspecified condition associated with female genital organs and menstrual cycle: Secondary | ICD-10-CM

## 2015-05-13 LAB — CBC
HCT: 36.9 % (ref 36.0–46.0)
Hemoglobin: 12.3 g/dL (ref 12.0–15.0)
MCH: 29.6 pg (ref 26.0–34.0)
MCHC: 33.3 g/dL (ref 30.0–36.0)
MCV: 88.9 fL (ref 78.0–100.0)
PLATELETS: 198 10*3/uL (ref 150–400)
RBC: 4.15 MIL/uL (ref 3.87–5.11)
RDW: 13.2 % (ref 11.5–15.5)
WBC: 14.3 10*3/uL — AB (ref 4.0–10.5)

## 2015-05-13 LAB — URINALYSIS, ROUTINE W REFLEX MICROSCOPIC
BILIRUBIN URINE: NEGATIVE
GLUCOSE, UA: NEGATIVE mg/dL
Hgb urine dipstick: NEGATIVE
KETONES UR: NEGATIVE mg/dL
Leukocytes, UA: NEGATIVE
NITRITE: NEGATIVE
PH: 5.5 (ref 5.0–8.0)
Protein, ur: NEGATIVE mg/dL
Specific Gravity, Urine: >1.03 — ABNORMAL HIGH (ref 1.005–1.030)
Urobilinogen, UA: 1 mg/dL (ref 0.0–1.0)

## 2015-05-13 LAB — I-STAT CHEM 8, ED
BUN: 9 mg/dL (ref 6–20)
CREATININE: 0.6 mg/dL (ref 0.44–1.00)
Calcium, Ion: 1.13 mmol/L (ref 1.12–1.23)
Chloride: 105 mmol/L (ref 101–111)
Glucose, Bld: 83 mg/dL (ref 65–99)
HEMATOCRIT: 41 % (ref 36.0–46.0)
Hemoglobin: 13.9 g/dL (ref 12.0–15.0)
POTASSIUM: 3.9 mmol/L (ref 3.5–5.1)
SODIUM: 136 mmol/L (ref 135–145)
TCO2: 20 mmol/L (ref 0–100)

## 2015-05-13 LAB — WET PREP, GENITAL
TRICH WET PREP: NONE SEEN
YEAST WET PREP: NONE SEEN

## 2015-05-13 LAB — I-STAT BETA HCG BLOOD, ED (MC, WL, AP ONLY)

## 2015-05-13 MED ORDER — METRONIDAZOLE 0.75 % VA GEL
1.0000 | Freq: Every day | VAGINAL | Status: DC
Start: 2015-05-13 — End: 2015-06-14

## 2015-05-13 MED ORDER — METRONIDAZOLE 500 MG PO TABS: 500.0000 mg | ORAL_TABLET | Freq: Two times a day (BID) | ORAL | Status: DC

## 2015-05-13 NOTE — ED Notes (Signed)
[facility] two weeks ago for the same, she complains of vaginal bleeding and discharge and pelvic pain

## 2015-05-13 NOTE — MAU Provider Note (Signed)
History     CSN: [id number]  Arrival date and time: 05/13/15 2211   First Provider Initiated Contact with Patient 05/13/15 2255      Chief Complaint  Patient presents with  . Vaginal Bleeding  . Abdominal Pain   HPI  [name] is a 24 y.o. female G2P1001 at 5624w1d presenting to MAU with vaginal bleeding. The vaginal bleeding started on Monday; the bleeding has remained the same. She was seen 4 weeks ago here in MAU for the same complaint and was told that her mucus plug was coming out. She has not been told why she is having vaginal bleeding. She feels that her discharge has a strong odor.   The patient see's Dr. Gaynell FaceMarshall for prenatal care, however has not been there due to her schedule. She is only able to go on Friday's and that day the office is closed.   She says that she has had abdominal pain throughout the pregnancy, however her pain has worsened especially when she walks or changes positions.    OB History    Gravida Para Term Preterm AB TAB SAB Ectopic Multiple Living   2 1 1  0 0 0 0 0 0 1      Past Medical History  Diagnosis Date  . Medical history non-contributory     Past Surgical History  Procedure Laterality Date  . Keloid excision      Family History  Problem Relation Age of Onset  . Diabetes Mother   . Hypertension Mother     Social History  Substance Use Topics  . Smoking status: Former Smoker    Types: Cigarettes    Quit date: 12/24/2014  . Smokeless tobacco: None  . Alcohol Use: Yes     Comment: denies 03/24/2015    Allergies:  Allergies  Allergen Reactions  . Peanut-Containing Drug Products Anaphylaxis  . Vancomycin Itching  . Penicillins Hives and Rash    Has patient had a PCN reaction causing immediate rash, facial/tongue/throat swelling, SOB or lightheadedness with hypotension:NO Has patient had a PCN reaction causing severe rash involving mucus membranes or skin necrosis:NO Has patient had a PCN reaction that required  hospitalization NO Has patient had a PCN reaction occurring within the last 10 years: NO If all of the above answers are "NO", then may proceed with Cephalosporin use.     Prescriptions prior to admission  Medication Sig Dispense Refill Last Dose  . Prenatal Vit-Fe Fumarate-FA (PRENATAL MULTIVITAMIN) TABS tablet Take 1 tablet by mouth daily at 12 noon.   05/13/2015 at Unknown time  . EPINEPHrine (EPIPEN 2-PAK) 0.3 mg/0.3 mL IJ SOAJ injection Inject 0.3 mg into the muscle once.   unknown at unknown  . loratadine (CLARITIN) 10 MG tablet Take 1 tablet (10 mg total) by mouth daily. 30 tablet 0   . sulfamethoxazole-trimethoprim (BACTRIM DS,SEPTRA DS) 800-160 MG tablet Take 1 tablet by mouth 2 (two) times daily. (Patient not taking: Reported on 04/27/2015) 14 tablet 1 Not Taking at Unknown time   Results for orders placed or performed during the hospital encounter of 05/13/15 (from the past 48 hour(s))  Wet prep, genital     Status: Abnormal   Collection Time: 05/13/15 11:00 PM  Result Value Ref Range   Yeast Wet Prep HPF POC NONE SEEN NONE SEEN   Trich, Wet Prep NONE SEEN NONE SEEN   Clue Cells Wet Prep HPF POC FEW (A) NONE SEEN   WBC, Wet Prep HPF POC MODERATE (A) NONE SEEN  Comment: BACTERIA- TOO NUMEROUS TO COUNT    Review of Systems  Constitutional: Negative for fever.  Gastrointestinal: Positive for abdominal pain. Negative for nausea and vomiting.  Genitourinary: Negative for dysuria.   Physical Exam   Blood pressure 132/74, pulse 88, temperature 98.8 F (37.1 C), resp. rate 18, height  (1.676 m), weight 240 lb 9.6 oz (109.135 kg), last menstrual period 01/20/2015.  Physical Exam  Constitutional: She is oriented to person, place, and time. She appears well-developed and well-nourished. No distress.  HENT:  Head: Normocephalic.  Eyes: Pupils are equal, round, and reactive to light.  Neck: Neck supple.  Genitourinary:  Speculum exam: Vagina - Small amount of creamy,  pale yellow discharge, + strong odor Cervix - No contact bleeding, no active bleeding  Bimanual exam: Cervix closed (external os FT, internal os closed) Uterus non tender, gravid  Adnexa non tender, no masses bilaterally Wet prep done Chaperone present for exam.  Musculoskeletal: Normal range of motion.  Neurological: She is alert and oriented to person, place, and time.  Skin: Skin is warm. She is not diaphoretic.  Psychiatric: Her behavior is normal.    MAU Course  Procedures  None  MDM  B negative blood type; patient received rhogam on February 26, 2015  Assessment and Plan   A:  1. Bacterial vaginosis   2. Round ligament pain    P:  Discharge home in stable condition RX: Flagyl Return to MAU for emergencies Discussed the importance of prenatal care; call Dr. Gaynell Face Pelvic rest Pregnancy support belt.    Duane Lope, NP 05/13/2015 11:42 PM

## 2015-05-13 NOTE — Discharge Instructions (Signed)

## 2015-05-13 NOTE — MAU Note (Signed)
I was here 2-3 wks ago. Had mucousy, bloody d/c and told it was my mucous plug. Same thing started again about a wk ago and just now had time to come in. Pelvic and abd pain

## 2015-05-13 NOTE — Progress Notes (Signed)
J Rasch NP in earlier to discuss test results and d/c plan. Written and verbal d/c instructions given and understanding voiced. 

## 2015-06-07 ENCOUNTER — Encounter (HOSPITAL_COMMUNITY): Payer: Self-pay

## 2015-06-07 ENCOUNTER — Inpatient Hospital Stay (HOSPITAL_COMMUNITY)
Admission: AD | Admit: 2015-06-07 | Discharge: 2015-06-07 | Disposition: A | Discharge status: Home / Self Care | Payer: Medicaid Other | Source: Ambulatory Visit | Attending: Obstetrics | Admitting: Obstetrics

## 2015-06-07 DIAGNOSIS — Z87891 Personal history of nicotine dependence: Secondary | ICD-10-CM | POA: Diagnosis not present

## 2015-06-07 DIAGNOSIS — J019 Acute sinusitis, unspecified: Secondary | ICD-10-CM | POA: Insufficient documentation

## 2015-06-07 DIAGNOSIS — A084 Viral intestinal infection, unspecified: Secondary | ICD-10-CM

## 2015-06-07 DIAGNOSIS — Z3A19 19 weeks gestation of pregnancy: Secondary | ICD-10-CM | POA: Diagnosis not present

## 2015-06-07 DIAGNOSIS — O210 Mild hyperemesis gravidarum: Secondary | ICD-10-CM | POA: Diagnosis present

## 2015-06-07 DIAGNOSIS — B9689 Other specified bacterial agents as the cause of diseases classified elsewhere: Secondary | ICD-10-CM | POA: Diagnosis not present

## 2015-06-07 DIAGNOSIS — Z880 Allergy status to penicillin: Secondary | ICD-10-CM | POA: Insufficient documentation

## 2015-06-07 DIAGNOSIS — O99612 Diseases of the digestive system complicating pregnancy, second trimester: Secondary | ICD-10-CM | POA: Insufficient documentation

## 2015-06-07 LAB — URINALYSIS, ROUTINE W REFLEX MICROSCOPIC
BILIRUBIN URINE: NEGATIVE
GLUCOSE, UA: NEGATIVE mg/dL
HGB URINE DIPSTICK: NEGATIVE
Ketones, ur: NEGATIVE mg/dL
Leukocytes, UA: NEGATIVE
Nitrite: NEGATIVE
Protein, ur: NEGATIVE mg/dL
pH: 6 (ref 5.0–8.0)

## 2015-06-07 MED ORDER — ACETAMINOPHEN 500 MG PO TABS
1000.0000 mg | ORAL_TABLET | Freq: Once | ORAL | Status: DC
  Filled 2015-06-07: qty 2

## 2015-06-07 MED ORDER — LACTATED RINGERS IV BOLUS (SEPSIS)
1000.0000 mL | Freq: Once | INTRAVENOUS | Status: AC
  Administered 2015-06-07: 1000 mL via INTRAVENOUS

## 2015-06-07 MED ORDER — ONDANSETRON HCL 4 MG/2ML IJ SOLN: 4.0000 mg | Freq: Once | INTRAMUSCULAR | Status: DC

## 2015-06-07 MED ORDER — PROMETHAZINE HCL 12.5 MG PO TABS: 12.5000 mg | ORAL_TABLET | Freq: Four times a day (QID) | ORAL | Status: DC | PRN

## 2015-06-07 MED ORDER — METOCLOPRAMIDE HCL 5 MG/ML IJ SOLN
10.0000 mg | Freq: Once | INTRAMUSCULAR | Status: AC
  Administered 2015-06-07: 10 mg via INTRAVENOUS
  Filled 2015-06-07: qty 2

## 2015-06-07 MED ORDER — AZITHROMYCIN 250 MG PO TABS: ORAL_TABLET | ORAL | Status: DC

## 2015-06-07 NOTE — Discharge Instructions (Signed)
Sinusitis, Adult Sinusitis is redness, soreness, and puffiness (inflammation) of the air pockets in the bones of your face (sinuses). The redness, soreness, and puffiness can cause air and mucus to get trapped in your sinuses. This can allow germs to grow and cause an infection.  HOME CARE   Drink enough fluids to keep your pee (urine) clear or pale yellow.  Use a humidifier in your home.  Run a hot shower to create steam in the bathroom. Sit in the bathroom with the door closed. Breathe in the steam 3-4 times a day.  Put a warm, moist washcloth on your face 3-4 times a day, or as told by your doctor.  Use salt water sprays (saline sprays) to wet the thick fluid in your nose. This can help the sinuses drain.  Only take medicine as told by your doctor. GET HELP RIGHT AWAY IF:   Your pain gets worse.  You have very bad headaches.  You are sick to your stomach (nauseous).  You throw up (vomit).  You are very sleepy (drowsy) all the time.  Your face is puffy (swollen).  Your vision changes.  You have a stiff neck.  You have trouble breathing. MAKE SURE YOU:   Understand these instructions.  Will watch your condition.  Will get help right away if you are not doing well or get worse.   This information is not intended to replace advice given to you by your health care provider. Make sure you discuss any questions you have with your health care provider.   Document Released: 12/12/2007 Document Revised: 07/16/2014 Document Reviewed: 01/29/2012 Elsevier Interactive Patient Education 2016 [employer]  Food Choices to Help Relieve Diarrhea, Adult When you have diarrhea, the foods you eat and your eating habits are very important. Choosing the right foods and drinks can help relieve diarrhea. Also, because diarrhea can last up to 7 days, you need to replace lost fluids and electrolytes (such as sodium, potassium, and chloride) in order to help prevent dehydration.  WHAT  GENERAL GUIDELINES DO I NEED TO FOLLOW?  Slowly drink 1 cup (8 oz) of fluid for each episode of diarrhea. If you are getting enough fluid, your urine will be clear or pale yellow.  Eat starchy foods. Some good choices include white rice, white toast, pasta, low-fiber cereal, baked potatoes (without the skin), saltine crackers, and bagels.  Avoid large servings of any cooked vegetables.  Limit fruit to two servings per day. A serving is  cup or 1 small piece.  Choose foods with less than 2 g of fiber per serving.  Limit fats to less than 8 tsp (38 g) per day.  Avoid fried foods.  Eat foods that have probiotics in them. Probiotics can be found in certain dairy products.  Avoid foods and beverages that may increase the speed at which food moves through the stomach and intestines (gastrointestinal tract). Things to avoid include:  High-fiber foods, such as dried fruit, raw fruits and vegetables, nuts, seeds, and whole grain foods.  Spicy foods and high-fat foods.  Foods and beverages sweetened with high-fructose corn syrup, honey, or sugar alcohols such as xylitol, sorbitol, and mannitol. WHAT FOODS ARE RECOMMENDED? Grains White rice. White, [place]French, or pita breads (fresh or toasted), including plain rolls, buns, or bagels. White pasta. Saltine, soda, or graham crackers. Pretzels. Low-fiber cereal. Cooked cereals made with water (such as cornmeal, farina, or cream cereals). Plain muffins. Matzo. Melba toast. Zwieback.  Vegetables Potatoes (without the skin). Strained tomato  and vegetable juices. Most well-cooked and canned vegetables without seeds. Tender lettuce. Fruits Cooked or canned applesauce, apricots, cherries, fruit cocktail, grapefruit, peaches, pears, or plums. Fresh bananas, apples without skin, cherries, grapes, cantaloupe, grapefruit, peaches, oranges, or plums.  Meat and Other Protein Products Baked or boiled chicken. Eggs. Tofu. Fish. Seafood. Smooth peanut butter. Ground  or well-cooked tender beef, ham, veal, lamb, pork, or poultry.  Dairy Plain yogurt, kefir, and unsweetened liquid yogurt. Lactose-free milk, buttermilk, or soy milk. Plain hard cheese. Beverages Sport drinks. Clear broths. Diluted fruit juices (except prune). Regular, caffeine-free sodas such as ginger ale. Water. Decaffeinated teas. Oral rehydration solutions. Sugar-free beverages not sweetened with sugar alcohols. Other Bouillon, broth, or soups made from recommended foods.  The items listed above may not be a complete list of recommended foods or beverages. Contact your dietitian for more options. WHAT FOODS ARE NOT RECOMMENDED? Grains Whole grain, whole wheat, bran, or rye breads, rolls, pastas, crackers, and cereals. Wild or brown rice. Cereals that contain more than 2 g of fiber per serving. Corn tortillas or taco shells. Cooked or dry oatmeal. Granola. Popcorn. Vegetables Raw vegetables. Cabbage, broccoli, Brussels sprouts, artichokes, baked beans, beet greens, corn, kale, legumes, peas, sweet potatoes, and yams. Potato skins. Cooked spinach and cabbage. Fruits Dried fruit, including raisins and dates. Raw fruits. Stewed or dried prunes. Fresh apples with skin, apricots, mangoes, pears, raspberries, and strawberries.  Meat and Other Protein Products Chunky peanut butter. Nuts and seeds. Beans and lentils. Tomasa Blase.  Dairy High-fat cheeses. Milk, chocolate milk, and beverages made with milk, such as milk shakes. Cream. Ice cream. Sweets and Desserts Sweet rolls, doughnuts, and sweet breads. Pancakes and waffles. Fats and Oils Butter. Cream sauces. Margarine. Salad oils. Plain salad dressings. Olives. Avocados.  Beverages Caffeinated beverages (such as coffee, tea, soda, or energy drinks). Alcoholic beverages. Fruit juices with pulp. Prune juice. Soft drinks sweetened with high-fructose corn syrup or sugar alcohols. Other Coconut. Hot sauce. Chili powder. Mayonnaise. Gravy. Cream-based or  milk-based soups.  The items listed above may not be a complete list of foods and beverages to avoid. Contact your dietitian for more information. WHAT SHOULD I DO IF I BECOME DEHYDRATED? Diarrhea can sometimes lead to dehydration. Signs of dehydration include dark urine and dry mouth and skin. If you think you are dehydrated, you should rehydrate with an oral rehydration solution. These solutions can be purchased at pharmacies, retail stores, or online.  Drink -1 cup (120-240 mL) of oral rehydration solution each time you have an episode of diarrhea. If drinking this amount makes your diarrhea worse, try drinking smaller amounts more often. For example, drink 1-3 tsp (5-15 mL) every 5-10 minutes.  A general rule for staying hydrated is to drink 1-2 L of fluid per day. Talk to your health care provider about the specific amount you should be drinking each day. Drink enough fluids to keep your urine clear or pale yellow.   This information is not intended to replace advice given to you by your health care provider. Make sure you discuss any questions you have with your health care provider.   Document Released: 09/15/2003 Document Revised: 07/16/2014 Document Reviewed: 05/18/2013 Elsevier Interactive Patient Education 2016 Elsevier Inc. Viral Gastroenteritis Viral gastroenteritis is also called stomach flu. This illness is caused by a certain type of germ (virus). It can cause sudden watery poop (diarrhea) and throwing up (vomiting). This can cause you to lose body fluids (dehydration). This illness usually lasts for 3 to 8  days. It usually goes away on its own. HOME CARE   Drink enough fluids to keep your pee (urine) clear or pale yellow. Drink small amounts of fluids often.  Ask your doctor how to replace body fluid losses (rehydration).  Avoid:  Foods high in sugar.  Alcohol.  Bubbly (carbonated) drinks.  Tobacco.  Juice.  Caffeine drinks.  Very hot or cold fluids.  Fatty,  greasy foods.  Eating too much at one time.  Dairy products until 24 to 48 hours after your watery poop stops.  You may eat foods with active cultures (probiotics). They can be found in some yogurts and supplements.  Wash your hands well to avoid spreading the illness.  Only take medicines as told by your doctor. Do not give aspirin to children. Do not take medicines for watery poop (antidiarrheals).  Ask your doctor if you should keep taking your regular medicines.  Keep all doctor visits as told. GET HELP RIGHT AWAY IF:   You cannot keep fluids down.  You do not pee at least once every 6 to 8 hours.  You are short of breath.  You see blood in your poop or throw up. This may look like coffee grounds.  You have belly (abdominal) pain that gets worse or is just in one small spot (localized).  You keep throwing up or having watery poop.  You have a fever.  The patient is a child younger than 3 months, and he or she has a fever.  The patient is a child older than 3 months, and he or she has a fever and problems that do not go away.  The patient is a child older than 3 months, and he or she has a fever and problems that suddenly get worse.  The patient is a baby, and he or she has no tears when crying. MAKE SURE YOU:   Understand these instructions.  Will watch your condition.  Will get help right away if you are not doing well or get worse.   This information is not intended to replace advice given to you by your health care provider. Make sure you discuss any questions you have with your health care provider.   Document Released: 12/12/2007 Document Revised: 09/17/2011 Document Reviewed: 04/11/2011 Elsevier Interactive Patient Education [employer]

## 2015-06-07 NOTE — MAU Provider Note (Signed)
History     CSN: [id number]  Arrival date and time: 06/07/15 2111   First Provider Initiated Contact with Patient 06/07/15 2221      No chief complaint on file.  HPI Ms. Molly Bowers is a 24 y.o. G2P1001 at 4510w5d who presents to MAU today with complaint of N/V/D and URI symptoms. The patient states that she has had N/V/D today. She states that other family members have the same symptoms. She states fever earlier today of [address] F at home. She last took Tylenol at 1830. She states 5 episodes of loose stools today. She also complaint of nasal congestion, sore throat, right ear pain and dry cough x 2 weeks. She has not tried anything OTC for her symptoms yet. She states that she was going to get Sudafed after [redacted] weeks gestation. She denies abdominal pain, contractions, vaginal bleeding or LOF today.   OB History    Gravida Para Term Preterm AB TAB SAB Ectopic Multiple Living   2 1 1  0 0 0 0 0 0 1      Past Medical History  Diagnosis Date  . Medical history non-contributory     Past Surgical History  Procedure Laterality Date  . Keloid excision      Family History  Problem Relation Age of Onset  . Diabetes Mother   . Hypertension Mother     Social History  Substance Use Topics  . Smoking status: Former Smoker    Types: Cigarettes    Quit date: 12/24/2014  . Smokeless tobacco: None  . Alcohol Use: Yes     Comment: denies 03/24/2015    Allergies:  Allergies  Allergen Reactions  . Peanut-Containing Drug Products Anaphylaxis  . Vancomycin Itching  . Penicillins Hives and Rash    Has patient had a PCN reaction causing immediate rash, facial/tongue/throat swelling, SOB or lightheadedness with hypotension:NO Has patient had a PCN reaction causing severe rash involving mucus membranes or skin necrosis:NO Has patient had a PCN reaction that required hospitalization NO Has patient had a PCN reaction occurring within the last 10 years: NO If all of the above answers are  "NO", then may proceed with Cephalosporin use.     No prescriptions prior to admission    Review of Systems  Constitutional: Positive for fever. Negative for malaise/fatigue.  HENT: Positive for congestion, ear pain and sore throat. Negative for ear discharge.   Respiratory: Positive for cough. Negative for shortness of breath.   Gastrointestinal: Positive for nausea, vomiting and diarrhea. Negative for abdominal pain and constipation.  Genitourinary: Negative for dysuria, urgency and frequency.       Neg - vaginal bleeding, discharge, LOF   Physical Exam   Blood pressure 112/60, pulse 90, temperature 98.2 F (36.8 C), temperature source Oral, resp. rate 20, height 5\' 5"  (1.651 m), weight 244 lb 9.6 oz (110.95 kg), last menstrual period 01/20/2015.  Physical Exam  Nursing note and vitals reviewed. Constitutional: She is oriented to person, place, and time. She appears well-developed and well-nourished. No distress.  HENT:  Head: Normocephalic and atraumatic.  Right Ear: Tympanic membrane, external ear and ear canal normal.  Left Ear: Tympanic membrane, external ear and ear canal normal.  Nose: Mucosal edema and rhinorrhea present. Right sinus exhibits no maxillary sinus tenderness and no frontal sinus tenderness. Left sinus exhibits no maxillary sinus tenderness and no frontal sinus tenderness.  Mouth/Throat: Uvula is midline and mucous membranes are normal. Posterior oropharyngeal edema and posterior oropharyngeal erythema present. No  oropharyngeal exudate or tonsillar abscesses.  Neck: Normal range of motion. Neck supple.  Cardiovascular: Normal rate, regular rhythm and normal heart sounds.   No murmur heard. Respiratory: Effort normal and breath sounds normal. No respiratory distress. She has no wheezes.  GI: Soft. She exhibits no distension and no mass. There is no tenderness. There is no rebound and no guarding.  Lymphadenopathy:       Head (right side): No submental, no  submandibular and no tonsillar adenopathy present.       Head (left side): No submental, no submandibular and no tonsillar adenopathy present.    She has no cervical adenopathy.  Neurological: She is alert and oriented to person, place, and time.  Skin: Skin is warm and dry. No erythema.  Psychiatric: She has a normal mood and affect.    Results for orders placed or performed during the hospital encounter of 06/07/15 (from the past 24 hour(s))  Urinalysis, Routine w reflex microscopic (not at [facility])     Status: Abnormal   Collection Time: 06/07/15  9:44 PM  Result Value Ref Range   Color, Urine YELLOW YELLOW   APPearance CLEAR CLEAR   Specific Gravity, Urine >1.030 (H) 1.005 - 1.030   pH 6.0 5.0 - 8.0   Glucose, UA NEGATIVE NEGATIVE mg/dL   Hgb urine dipstick NEGATIVE NEGATIVE   Bilirubin Urine NEGATIVE NEGATIVE   Ketones, ur NEGATIVE NEGATIVE mg/dL   Protein, ur NEGATIVE NEGATIVE mg/dL   Nitrite NEGATIVE NEGATIVE   Leukocytes, UA NEGATIVE NEGATIVE  Rapid strep screen (not at [facility])     Status: None   Collection Time: 06/07/15 10:35 PM  Result Value Ref Range   Streptococcus, Group A Screen (Direct) NEGATIVE NEGATIVE    MAU Course  Procedures None  MDM FHR - 147 bpm with doppler UA today shows mild dehydration. IV LR bolus with 10 mg Reglan given for N/V/D.  Patient refused Zofran today Rapid strep today - pending at time of discharge Patient requests discharge, has to pick up child IV fluids completed Refuses Tylenol Patient is afebrile in MAU today. Will still consider treatment for sinusitis given duration of symptoms.   Assessment and Plan  A: SIUP at [redacted]w[redacted]d Viral gastroenteritis Acute bacterial rhinosinusitis  P: Discharge home Rx for Zithromax and Phenergan given/sent to patient's pharmacy Warning signs for worsening condition discussed Strep pending at time of discharge. Will contact with abnormal results Patient advised to follow-up with Dr. Gaynell Face as  scheduled for routine prenatal care or sooner PRN Patient may return to MAU as needed or if her condition were to change or worsen   Marny Lowenstein, PA-C  06/08/2015, 1:26 AM

## 2015-06-07 NOTE — MAU Note (Signed)
Pt reports a fever and cold symptoms for about a week, also reports sore throat and earache today. States she began having vomiting and diarrhea today.

## 2015-06-08 LAB — RAPID STREP SCREEN (MED CTR MEBANE ONLY): STREPTOCOCCUS, GROUP A SCREEN (DIRECT): NEGATIVE

## 2015-06-10 LAB — CULTURE, GROUP A STREP: STREP A CULTURE: NEGATIVE

## 2015-06-14 ENCOUNTER — Inpatient Hospital Stay (HOSPITAL_COMMUNITY)
Admission: AD | Admit: 2015-06-14 | Discharge: 2015-06-14 | Disposition: A | Discharge status: Home / Self Care | Payer: Medicaid Other | Source: Ambulatory Visit | Attending: Obstetrics | Admitting: Obstetrics

## 2015-06-14 ENCOUNTER — Encounter (HOSPITAL_COMMUNITY): Payer: Self-pay | Admitting: *Deleted

## 2015-06-14 DIAGNOSIS — K219 Gastro-esophageal reflux disease without esophagitis: Secondary | ICD-10-CM | POA: Diagnosis not present

## 2015-06-14 DIAGNOSIS — Z880 Allergy status to penicillin: Secondary | ICD-10-CM | POA: Insufficient documentation

## 2015-06-14 DIAGNOSIS — B349 Viral infection, unspecified: Secondary | ICD-10-CM | POA: Diagnosis not present

## 2015-06-14 DIAGNOSIS — Z87891 Personal history of nicotine dependence: Secondary | ICD-10-CM | POA: Insufficient documentation

## 2015-06-14 DIAGNOSIS — R51 Headache: Secondary | ICD-10-CM | POA: Diagnosis present

## 2015-06-14 LAB — CBC
HCT: 35.7 % — ABNORMAL LOW (ref 36.0–46.0)
Hemoglobin: 11.8 g/dL — ABNORMAL LOW (ref 12.0–15.0)
MCH: 28.6 pg (ref 26.0–34.0)
MCHC: 33.1 g/dL (ref 30.0–36.0)
MCV: 86.7 fL (ref 78.0–100.0)
Platelets: 206 10*3/uL (ref 150–400)
RBC: 4.12 MIL/uL (ref 3.87–5.11)
RDW: 13 % (ref 11.5–15.5)
WBC: 18.7 10*3/uL — ABNORMAL HIGH (ref 4.0–10.5)

## 2015-06-14 LAB — URINE MICROSCOPIC-ADD ON

## 2015-06-14 LAB — URINALYSIS, ROUTINE W REFLEX MICROSCOPIC
Bilirubin Urine: NEGATIVE
Glucose, UA: NEGATIVE mg/dL
Hgb urine dipstick: NEGATIVE
Ketones, ur: NEGATIVE mg/dL
Nitrite: NEGATIVE
Protein, ur: NEGATIVE mg/dL
Specific Gravity, Urine: 1.025 (ref 1.005–1.030)
pH: 8.5 — ABNORMAL HIGH (ref 5.0–8.0)

## 2015-06-14 MED ORDER — BUTALBITAL-APAP-CAFFEINE 50-325-40 MG PO TABS: 1.0000 | ORAL_TABLET | Freq: Four times a day (QID) | ORAL | Status: DC | PRN

## 2015-06-14 MED ORDER — RANITIDINE HCL 150 MG PO TABS: 150.0000 mg | ORAL_TABLET | Freq: Every day | ORAL | Status: DC

## 2015-06-14 MED ORDER — FAMOTIDINE 20 MG PO TABS
20.0000 mg | ORAL_TABLET | Freq: Once | ORAL | Status: AC
  Administered 2015-06-14: 20 mg via ORAL
  Filled 2015-06-14: qty 1

## 2015-06-14 MED ORDER — LACTATED RINGERS IV BOLUS (SEPSIS)
1000.0000 mL | Freq: Once | INTRAVENOUS | Status: AC
  Administered 2015-06-14: 1000 mL via INTRAVENOUS

## 2015-06-14 MED ORDER — BUTALBITAL-APAP-CAFFEINE 50-325-40 MG PO TABS
1.0000 | ORAL_TABLET | Freq: Once | ORAL | Status: AC
  Administered 2015-06-14: 1 via ORAL
  Filled 2015-06-14: qty 1

## 2015-06-14 MED ORDER — PROMETHAZINE HCL 25 MG PO TABS: 25.0000 mg | ORAL_TABLET | Freq: Four times a day (QID) | ORAL | Status: DC | PRN

## 2015-06-14 NOTE — Discharge Instructions (Signed)
Food Choices for Gastroesophageal Reflux Disease, Adult When you have gastroesophageal reflux disease (GERD), the foods you eat and your eating habits are very important. Choosing the right foods can help ease your discomfort.  WHAT GUIDELINES DO I NEED TO FOLLOW?   Choose fruits, vegetables, whole grains, and low-fat dairy products.   Choose low-fat meat, fish, and poultry.  Limit fats such as oils, salad dressings, butter, nuts, and avocado.   Keep a food diary. This helps you identify foods that cause symptoms.   Avoid foods that cause symptoms. These may be different for everyone.   Eat small meals often instead of 3 large meals a day.   Eat your meals slowly, in a place where you are relaxed.   Limit fried foods.   Cook foods using methods other than frying.   Avoid drinking alcohol.   Avoid drinking large amounts of liquids with your meals.   Avoid bending over or lying down until 2-3 hours after eating.  WHAT FOODS ARE NOT RECOMMENDED?  These are some foods and drinks that may make your symptoms worse: Vegetables Tomatoes. Tomato juice. Tomato and spaghetti sauce. Chili peppers. Onion and garlic. Horseradish. Fruits Oranges, grapefruit, and lemon (fruit and juice). Meats High-fat meats, fish, and poultry. This includes hot dogs, ribs, ham, sausage, salami, and bacon. Dairy Whole milk and chocolate milk. Sour cream. Cream. Butter. Ice cream. Cream cheese.  Drinks Coffee and tea. Bubbly (carbonated) drinks or energy drinks. Condiments Hot sauce. Barbecue sauce.  Sweets/Desserts Chocolate and cocoa. Donuts. Peppermint and spearmint. Fats and Oils High-fat foods. This includes Jamaica[place] fries and potato chips. Other Vinegar. Strong spices. This includes black pepper, white pepper, red pepper, cayenne, curry powder, cloves, ginger, and chili powder. The items listed above may not be a complete list of foods and drinks to avoid. Contact your dietitian for more  information.   This information is not intended to replace advice given to you by your health care provider. Make sure you discuss any questions you have with your health care provider.   Document Released: 12/25/2011 Document Revised: 07/16/2014 Document Reviewed: 04/29/2013 Elsevier Interactive Patient Education 2016 Elsevier Inc. General Headache Without Cause A headache is pain or discomfort felt around the head or neck area. The specific cause of a headache may not be found. There are many causes and types of headaches. A few common ones are:  Tension headaches.  Migraine headaches.  Cluster headaches.  Chronic daily headaches. HOME CARE INSTRUCTIONS  Watch your condition for any changes. Take these steps to help with your condition: Managing Pain  Take over-the-counter and prescription medicines only as told by your health care provider.  Lie down in a dark, quiet room when you have a headache.  If directed, apply ice to the head and neck area:  Put ice in a plastic bag.  Place a towel between your skin and the bag.  Leave the ice on for 20 minutes, 2-3 times per day.  Use a heating pad or hot shower to apply heat to the head and neck area as told by your health care provider.  Keep lights dim if bright lights bother you or make your headaches worse. Eating and Drinking  Eat meals on a regular schedule.  Limit alcohol use.  Decrease the amount of caffeine you drink, or stop drinking caffeine. General Instructions  Keep all follow-up visits as told by your health care provider. This is important.  Keep a headache journal to help find out what  may trigger your headaches. For example, write down:  What you eat and drink.  How much sleep you get.  Any change to your diet or medicines.  Try massage or other relaxation techniques.  Limit stress.  Sit up straight, and do not tense your muscles.  Do not use tobacco products, including cigarettes, chewing  tobacco, or e-cigarettes. If you need help quitting, ask your health care provider.  Exercise regularly as told by your health care provider.  Sleep on a regular schedule. Get 7-9 hours of sleep, or the amount recommended by your health care provider. SEEK MEDICAL CARE IF:   Your symptoms are not helped by medicine.  You have a headache that is different from the usual headache.  You have nausea or you vomit.  You have a fever. SEEK IMMEDIATE MEDICAL CARE IF:   Your headache becomes severe.  You have repeated vomiting.  You have a stiff neck.  You have a loss of vision.  You have problems with speech.  You have pain in the eye or ear.  You have muscular weakness or loss of muscle control.  You lose your balance or have trouble walking.  You feel faint or pass out.  You have confusion.   This information is not intended to replace advice given to you by your health care provider. Make sure you discuss any questions you have with your health care provider.   Document Released: 06/25/2005 Document Revised: 03/16/2015 Document Reviewed: 10/18/2014 Elsevier Interactive Patient Education [employer]

## 2015-06-14 NOTE — MAU Provider Note (Signed)
History     CSN: [id number]  Arrival date and time: 06/14/15 1332   First Provider Initiated Contact with Patient 06/14/15 1526      Chief Complaint  Patient presents with  . Headache   HPI  Molly Bowers 24 y.o. [id number] @ 2958w5d presents with feeling dehydrated, dry mouth, spitting, reflux over the past 3 days. She reports having a headache. She denies being around anyone who is sick. Reports positive fetal movement. No vaginal bleeding, LOF or contractions  Past Medical History  Diagnosis Date  . Medical history non-contributory     Past Surgical History  Procedure Laterality Date  . Keloid excision      Family History  Problem Relation Age of Onset  . Diabetes Mother   . Hypertension Mother     Social History  Substance Use Topics  . Smoking status: Former Smoker    Types: Cigarettes    Quit date: 12/24/2014  . Smokeless tobacco: None  . Alcohol Use: Yes     Comment: denies 03/24/2015    Allergies:  Allergies  Allergen Reactions  . Peanut-Containing Drug Products Anaphylaxis  . Vancomycin Itching  . Penicillins Hives and Rash    Has patient had a PCN reaction causing immediate rash, facial/tongue/throat swelling, SOB or lightheadedness with hypotension:NO Has patient had a PCN reaction causing severe rash involving mucus membranes or skin necrosis:NO Has patient had a PCN reaction that required hospitalization NO Has patient had a PCN reaction occurring within the last 10 years: NO If all of the above answers are "NO", then may proceed with Cephalosporin use.     Prescriptions prior to admission  Medication Sig Dispense Refill Last Dose  . azithromycin (ZITHROMAX) 250 MG tablet Take 2 tabs (500 mg) on day 1 and then take 1 tab (250 mg) daily 2-5 6 each 0   . EPINEPHrine (EPIPEN 2-PAK) 0.3 mg/0.3 mL IJ SOAJ injection Inject 0.3 mg into the muscle once.   unknown at unknown  . loratadine (CLARITIN) 10 MG tablet Take 1 tablet (10 mg total) by mouth  daily. 30 tablet 0   . metroNIDAZOLE (METROGEL VAGINAL) 0.75 % vaginal gel Place 1 Applicatorful vaginally at bedtime. 70 g 0   . Prenatal Vit-Fe Fumarate-FA (PRENATAL MULTIVITAMIN) TABS tablet Take 1 tablet by mouth daily at 12 noon.   05/13/2015 at Unknown time  . promethazine (PHENERGAN) 12.5 MG tablet Take 1 tablet (12.5 mg total) by mouth every 6 (six) hours as needed for nausea or vomiting. 30 tablet 0     Review of Systems  Constitutional: Negative for fever.  Gastrointestinal:       Spitting; reflux   Physical Exam   Blood pressure 111/63, pulse 104, temperature 98 F (36.7 C), temperature source Oral, resp. rate 18, last menstrual period 01/20/2015.  Physical Exam  Nursing note and vitals reviewed. Constitutional: She is oriented to person, place, and time. She appears well-developed and well-nourished.  HENT:  Head: Normocephalic and atraumatic.  Neck: Normal range of motion.  Cardiovascular: Normal rate.   Respiratory: Effort normal and breath sounds normal. No respiratory distress.  GI: Soft.  Musculoskeletal: Normal range of motion.  Neurological: She is alert and oriented to person, place, and time.  Skin: Skin is warm and dry.  Psychiatric: She has a normal mood and affect. Her behavior is normal. Judgment and thought content normal.   Results for orders placed or performed during the hospital encounter of 06/14/15 (from the past 24 hour(s))  Urinalysis,  Routine w reflex microscopic (not at [facility])     Status: Abnormal   Collection Time: 06/14/15  2:45 PM  Result Value Ref Range   Color, Urine YELLOW YELLOW   APPearance CLOUDY (A) CLEAR   Specific Gravity, Urine 1.025 1.005 - 1.030   pH 8.5 (H) 5.0 - 8.0   Glucose, UA NEGATIVE NEGATIVE mg/dL   Hgb urine dipstick NEGATIVE NEGATIVE   Bilirubin Urine NEGATIVE NEGATIVE   Ketones, ur NEGATIVE NEGATIVE mg/dL   Protein, ur NEGATIVE NEGATIVE mg/dL   Nitrite NEGATIVE NEGATIVE   Leukocytes, UA TRACE (A) NEGATIVE  Urine  microscopic-add on     Status: Abnormal   Collection Time: 06/14/15  2:45 PM  Result Value Ref Range   Squamous Epithelial / LPF 6-30 (A) NONE SEEN   WBC, UA 0-5 0 - 5 WBC/hpf   RBC / HPF 0-5 0 - 5 RBC/hpf   Bacteria, UA MANY (A) NONE SEEN   Urine-Other AMORPHOUS URATES/PHOSPHATES   CBC     Status: Abnormal   Collection Time: 06/14/15  3:57 PM  Result Value Ref Range   WBC 18.7 (H) 4.0 - 10.5 K/uL   RBC 4.12 3.87 - 5.11 MIL/uL   Hemoglobin 11.8 (L) 12.0 - 15.0 g/dL   HCT 16.1 (L) 09.6 - 04.5 %   MCV 86.7 78.0 - 100.0 fL   MCH 28.6 26.0 - 34.0 pg   MCHC 33.1 30.0 - 36.0 g/dL   RDW 40.9 81.1 - 91.4 %   Platelets 206 150 - 400 K/uL    MAU Course  Procedures  MDM Probable viral illness. Headache has improved with fioricet. Pt is feeling better. Elevated WBC; Afebrile.  Assessment and Plan  Viral Illness Gastroesophageal reflux  Discharge to home  [facility] 06/14/2015, 3:46 PM

## 2015-06-14 NOTE — MAU Note (Signed)
Pt C/O HA for the last 3 days, has increased thirst, lips are dry, feels dehydrated.  Pt states she is spitting up some, but not actually vomiting, denies diarrhea.  Feeling dizzy.  Denies uc's, bleeding or LOF.

## 2015-10-04 LAB — OB RESULTS CONSOLE GBS: GBS: NEGATIVE

## 2015-10-16 ENCOUNTER — Inpatient Hospital Stay (HOSPITAL_COMMUNITY)
Admission: AD | Admit: 2015-10-16 | Discharge: 2015-10-16 | Disposition: A | Discharge status: Home / Self Care | Payer: Medicaid Other | Source: Ambulatory Visit | Attending: Obstetrics | Admitting: Obstetrics

## 2015-10-16 NOTE — MAU Note (Signed)
PT refused to be evaluated due to having child taken out of hospital with another family member that was with her. Pt states " I just won't be evaluated"

## 2015-10-18 ENCOUNTER — Other Ambulatory Visit: Payer: Self-pay | Admitting: Obstetrics

## 2015-10-19 ENCOUNTER — Encounter (HOSPITAL_COMMUNITY): Payer: Self-pay | Admitting: *Deleted

## 2015-10-19 ENCOUNTER — Telehealth (HOSPITAL_COMMUNITY): Payer: Self-pay | Admitting: *Deleted

## 2015-10-19 NOTE — Telephone Encounter (Signed)
Preadmission screen  

## 2015-10-28 ENCOUNTER — Inpatient Hospital Stay (HOSPITAL_COMMUNITY)
Admission: RE | Admit: 2015-10-28 | Discharge: 2015-10-31 | DRG: 765 | Disposition: A | Discharge status: Home / Self Care | Payer: Medicaid Other | Source: Ambulatory Visit | Attending: Obstetrics | Admitting: Obstetrics

## 2015-10-28 ENCOUNTER — Encounter (HOSPITAL_COMMUNITY): Payer: Self-pay

## 2015-10-28 ENCOUNTER — Inpatient Hospital Stay (HOSPITAL_COMMUNITY): Payer: Medicaid Other | Admitting: Anesthesiology

## 2015-10-28 ENCOUNTER — Encounter (HOSPITAL_COMMUNITY)
Admission: RE | Disposition: A | Discharge status: Home / Self Care | Payer: Self-pay | Source: Ambulatory Visit | Attending: Obstetrics

## 2015-10-28 DIAGNOSIS — Z98891 History of uterine scar from previous surgery: Secondary | ICD-10-CM

## 2015-10-28 DIAGNOSIS — Z6841 Body Mass Index (BMI) 40.0 and over, adult: Secondary | ICD-10-CM | POA: Diagnosis not present

## 2015-10-28 DIAGNOSIS — Z87891 Personal history of nicotine dependence: Secondary | ICD-10-CM | POA: Diagnosis not present

## 2015-10-28 DIAGNOSIS — Z3A40 40 weeks gestation of pregnancy: Secondary | ICD-10-CM

## 2015-10-28 DIAGNOSIS — O99214 Obesity complicating childbirth: Secondary | ICD-10-CM | POA: Diagnosis present

## 2015-10-28 LAB — CBC
HCT: 34.1 % — ABNORMAL LOW (ref 36.0–46.0)
Hemoglobin: 11.4 g/dL — ABNORMAL LOW (ref 12.0–15.0)
MCH: 28.9 pg (ref 26.0–34.0)
MCHC: 33.4 g/dL (ref 30.0–36.0)
MCV: 86.3 fL (ref 78.0–100.0)
PLATELETS: 188 10*3/uL (ref 150–400)
RBC: 3.95 MIL/uL (ref 3.87–5.11)
RDW: 14.2 % (ref 11.5–15.5)
WBC: 13.3 10*3/uL — ABNORMAL HIGH (ref 4.0–10.5)

## 2015-10-28 SURGERY — Surgical Case
Anesthesia: Epidural | Site: Abdomen

## 2015-10-28 MED ORDER — SIMETHICONE 80 MG PO CHEW
80.0000 mg | CHEWABLE_TABLET | Freq: Three times a day (TID) | ORAL | Status: DC
  Administered 2015-10-29 – 2015-10-30 (×6): 80 mg via ORAL
  Filled 2015-10-28 (×4): qty 1

## 2015-10-28 MED ORDER — FENTANYL 2.5 MCG/ML BUPIVACAINE 1/10 % EPIDURAL INFUSION (WH - ANES)
14.0000 mL/h | INTRAMUSCULAR | Status: DC | PRN
  Administered 2015-10-28: 14 mL/h via EPIDURAL
  Filled 2015-10-28: qty 125

## 2015-10-28 MED ORDER — PHENYLEPHRINE 40 MCG/ML (10ML) SYRINGE FOR IV PUSH (FOR BLOOD PRESSURE SUPPORT)
PREFILLED_SYRINGE | INTRAVENOUS | Status: AC
  Filled 2015-10-28: qty 20

## 2015-10-28 MED ORDER — OXYTOCIN 10 UNIT/ML IJ SOLN
1.0000 m[IU]/min | INTRAMUSCULAR | Status: DC
  Administered 2015-10-28: 2 m[IU]/min via INTRAVENOUS

## 2015-10-28 MED ORDER — LIDOCAINE HCL (PF) 1 % IJ SOLN
INTRAMUSCULAR | Status: DC | PRN
  Administered 2015-10-28 (×2): 4 mL

## 2015-10-28 MED ORDER — SIMETHICONE 80 MG PO CHEW
80.0000 mg | CHEWABLE_TABLET | ORAL | Status: DC
  Filled 2015-10-28 (×3): qty 1

## 2015-10-28 MED ORDER — TERBUTALINE SULFATE 1 MG/ML IJ SOLN: 0.2500 mg | Freq: Once | INTRAMUSCULAR | Status: DC | PRN

## 2015-10-28 MED ORDER — MEPERIDINE HCL 25 MG/ML IJ SOLN
INTRAMUSCULAR | Status: AC
  Filled 2015-10-28: qty 1

## 2015-10-28 MED ORDER — CITRIC ACID-SODIUM CITRATE 334-500 MG/5ML PO SOLN
30.0000 mL | ORAL | Status: DC | PRN
  Filled 2015-10-28: qty 15

## 2015-10-28 MED ORDER — SCOPOLAMINE 1 MG/3DAYS TD PT72
MEDICATED_PATCH | TRANSDERMAL | Status: AC
  Filled 2015-10-28: qty 1

## 2015-10-28 MED ORDER — LIDOCAINE-EPINEPHRINE (PF) 2 %-1:200000 IJ SOLN
INTRAMUSCULAR | Status: DC | PRN
  Administered 2015-10-28 (×3): 5 mL via EPIDURAL

## 2015-10-28 MED ORDER — TERBUTALINE SULFATE 1 MG/ML IJ SOLN
0.2500 mg | Freq: Once | INTRAMUSCULAR | Status: AC | PRN
  Administered 2015-10-28: 0.25 mg via SUBCUTANEOUS
  Filled 2015-10-28: qty 1

## 2015-10-28 MED ORDER — DIPHENHYDRAMINE HCL 25 MG PO CAPS
25.0000 mg | ORAL_CAPSULE | Freq: Four times a day (QID) | ORAL | Status: DC | PRN
Start: 2015-10-28 — End: 2015-10-31
  Administered 2015-10-29: 25 mg via ORAL
  Filled 2015-10-28 (×2): qty 1

## 2015-10-28 MED ORDER — LACTATED RINGERS IV SOLN
INTRAVENOUS | Status: DC
  Administered 2015-10-28: 300 mL via INTRAUTERINE

## 2015-10-28 MED ORDER — MORPHINE SULFATE (PF) 0.5 MG/ML IJ SOLN
INTRAMUSCULAR | Status: DC | PRN
  Administered 2015-10-28: 4 mg via EPIDURAL

## 2015-10-28 MED ORDER — OXYTOCIN 10 UNIT/ML IJ SOLN: 2.5000 [IU]/h | INTRAVENOUS | Status: AC

## 2015-10-28 MED ORDER — SODIUM CHLORIDE 0.9 % IR SOLN
Status: DC | PRN
  Administered 2015-10-28: 1000 mL

## 2015-10-28 MED ORDER — DEXAMETHASONE SODIUM PHOSPHATE 4 MG/ML IJ SOLN
INTRAMUSCULAR | Status: AC
  Filled 2015-10-28: qty 1

## 2015-10-28 MED ORDER — LACTATED RINGERS IV SOLN
INTRAVENOUS | Status: DC | PRN
  Administered 2015-10-28 (×3): via INTRAVENOUS

## 2015-10-28 MED ORDER — PHENYLEPHRINE 40 MCG/ML (10ML) SYRINGE FOR IV PUSH (FOR BLOOD PRESSURE SUPPORT)
80.0000 ug | PREFILLED_SYRINGE | INTRAVENOUS | Status: DC | PRN
  Filled 2015-10-28: qty 20

## 2015-10-28 MED ORDER — LIDOCAINE HCL (PF) 1 % IJ SOLN: 30.0000 mL | INTRAMUSCULAR | Status: DC | PRN

## 2015-10-28 MED ORDER — OXYTOCIN BOLUS FROM INFUSION: 500.0000 mL | INTRAVENOUS | Status: DC

## 2015-10-28 MED ORDER — DIPHENHYDRAMINE HCL 50 MG/ML IJ SOLN
12.5000 mg | INTRAMUSCULAR | Status: DC | PRN
  Administered 2015-10-28: 12.5 mg via INTRAVENOUS
  Filled 2015-10-28: qty 1

## 2015-10-28 MED ORDER — PRENATAL MULTIVITAMIN CH
1.0000 | ORAL_TABLET | Freq: Every day | ORAL | Status: DC
  Administered 2015-10-29 – 2015-10-30 (×2): 1 via ORAL
  Filled 2015-10-28 (×2): qty 1

## 2015-10-28 MED ORDER — OXYTOCIN 10 UNIT/ML IJ SOLN
2.5000 [IU]/h | INTRAVENOUS | Status: DC
  Filled 2015-10-28: qty 4

## 2015-10-28 MED ORDER — ONDANSETRON HCL 4 MG/2ML IJ SOLN
INTRAMUSCULAR | Status: AC
  Filled 2015-10-28: qty 2

## 2015-10-28 MED ORDER — MENTHOL 3 MG MT LOZG: 1.0000 | LOZENGE | OROMUCOSAL | Status: DC | PRN

## 2015-10-28 MED ORDER — GENTAMICIN SULFATE 40 MG/ML IJ SOLN
Freq: Once | INTRAVENOUS | Status: AC
  Administered 2015-10-28: 120.25 mL via INTRAVENOUS
  Filled 2015-10-28: qty 14.25

## 2015-10-28 MED ORDER — SODIUM CHLORIDE 0.9 % IJ SOLN
INTRAMUSCULAR | Status: AC
  Filled 2015-10-28: qty 10

## 2015-10-28 MED ORDER — WITCH HAZEL-GLYCERIN EX PADS: 1.0000 "application " | MEDICATED_PAD | CUTANEOUS | Status: DC | PRN

## 2015-10-28 MED ORDER — OXYTOCIN 10 UNIT/ML IJ SOLN
40.0000 [IU] | INTRAVENOUS | Status: DC | PRN
  Administered 2015-10-28: 40 [IU] via INTRAVENOUS

## 2015-10-28 MED ORDER — LACTATED RINGERS IV SOLN
500.0000 mL | INTRAVENOUS | Status: DC | PRN
  Administered 2015-10-28 (×2): 1000 mL via INTRAVENOUS

## 2015-10-28 MED ORDER — OXYTOCIN 10 UNIT/ML IJ SOLN
INTRAMUSCULAR | Status: AC
  Filled 2015-10-28: qty 4

## 2015-10-28 MED ORDER — SCOPOLAMINE 1 MG/3DAYS TD PT72
MEDICATED_PATCH | TRANSDERMAL | Status: DC | PRN
  Administered 2015-10-28: 1 via TRANSDERMAL

## 2015-10-28 MED ORDER — SENNOSIDES-DOCUSATE SODIUM 8.6-50 MG PO TABS
2.0000 | ORAL_TABLET | ORAL | Status: DC
  Administered 2015-10-29 – 2015-10-30 (×3): 2 via ORAL
  Filled 2015-10-28 (×3): qty 2

## 2015-10-28 MED ORDER — ZOLPIDEM TARTRATE 5 MG PO TABS: 5.0000 mg | ORAL_TABLET | Freq: Every evening | ORAL | Status: DC | PRN

## 2015-10-28 MED ORDER — FENTANYL CITRATE (PF) 100 MCG/2ML IJ SOLN
INTRAMUSCULAR | Status: AC
  Filled 2015-10-28: qty 2

## 2015-10-28 MED ORDER — ONDANSETRON HCL 4 MG/2ML IJ SOLN
INTRAMUSCULAR | Status: DC | PRN
  Administered 2015-10-28: 4 mg via INTRAVENOUS

## 2015-10-28 MED ORDER — COCONUT OIL OIL: 1.0000 "application " | TOPICAL_OIL | Status: DC | PRN

## 2015-10-28 MED ORDER — TETANUS-DIPHTH-ACELL PERTUSSIS 5-2.5-18.5 LF-MCG/0.5 IM SUSP: 0.5000 mL | Freq: Once | INTRAMUSCULAR | Status: DC

## 2015-10-28 MED ORDER — ACETAMINOPHEN 325 MG PO TABS
650.0000 mg | ORAL_TABLET | ORAL | Status: DC | PRN
  Administered 2015-10-29 – 2015-10-30 (×3): 650 mg via ORAL
  Filled 2015-10-28 (×3): qty 2

## 2015-10-28 MED ORDER — PHENYLEPHRINE 40 MCG/ML (10ML) SYRINGE FOR IV PUSH (FOR BLOOD PRESSURE SUPPORT): 80.0000 ug | PREFILLED_SYRINGE | INTRAVENOUS | Status: DC | PRN

## 2015-10-28 MED ORDER — MEPERIDINE HCL 25 MG/ML IJ SOLN
INTRAMUSCULAR | Status: DC | PRN
  Administered 2015-10-28 (×2): 12.5 mg via INTRAVENOUS

## 2015-10-28 MED ORDER — LACTATED RINGERS IV SOLN: 500.0000 mL | Freq: Once | INTRAVENOUS | Status: DC

## 2015-10-28 MED ORDER — LACTATED RINGERS IV SOLN
INTRAVENOUS | Status: DC
  Administered 2015-10-28: 23:00:00 via INTRAVENOUS

## 2015-10-28 MED ORDER — FLEET ENEMA 7-19 GM/118ML RE ENEM: 1.0000 | ENEMA | RECTAL | Status: DC | PRN

## 2015-10-28 MED ORDER — EPHEDRINE 5 MG/ML INJ: 10.0000 mg | INTRAVENOUS | Status: DC | PRN

## 2015-10-28 MED ORDER — DIBUCAINE 1 % RE OINT: 1.0000 "application " | TOPICAL_OINTMENT | RECTAL | Status: DC | PRN

## 2015-10-28 MED ORDER — DEXAMETHASONE SODIUM PHOSPHATE 4 MG/ML IJ SOLN
INTRAMUSCULAR | Status: DC | PRN
  Administered 2015-10-28: 4 mg via INTRAVENOUS

## 2015-10-28 MED ORDER — LACTATED RINGERS IV SOLN: INTRAVENOUS | Status: DC

## 2015-10-28 MED ORDER — FENTANYL CITRATE (PF) 100 MCG/2ML IJ SOLN
25.0000 ug | INTRAMUSCULAR | Status: DC | PRN
  Administered 2015-10-28: 25 ug via INTRAVENOUS

## 2015-10-28 MED ORDER — SIMETHICONE 80 MG PO CHEW: 80.0000 mg | CHEWABLE_TABLET | ORAL | Status: DC | PRN

## 2015-10-28 MED ORDER — MORPHINE SULFATE (PF) 0.5 MG/ML IJ SOLN
INTRAMUSCULAR | Status: AC
Start: 2015-10-28 — End: 2015-10-28
  Filled 2015-10-28: qty 10

## 2015-10-28 MED ORDER — PHENYLEPHRINE HCL 10 MG/ML IJ SOLN
INTRAMUSCULAR | Status: DC | PRN
  Administered 2015-10-28: 120 ug via INTRAVENOUS
  Administered 2015-10-28 (×3): 80 ug via INTRAVENOUS

## 2015-10-28 MED ORDER — LACTATED RINGERS IV SOLN
INTRAVENOUS | Status: DC
  Administered 2015-10-28: 18:00:00 via INTRAVENOUS

## 2015-10-28 MED ORDER — IBUPROFEN 600 MG PO TABS
600.0000 mg | ORAL_TABLET | Freq: Four times a day (QID) | ORAL | Status: DC
  Administered 2015-10-29 – 2015-10-31 (×10): 600 mg via ORAL
  Filled 2015-10-28 (×10): qty 1

## 2015-10-28 MED ORDER — ONDANSETRON HCL 4 MG/2ML IJ SOLN
4.0000 mg | Freq: Four times a day (QID) | INTRAMUSCULAR | Status: DC | PRN
Start: 2015-10-28 — End: 2015-10-28

## 2015-10-28 SURGICAL SUPPLY — 35 items
APL SKNCLS STERI-STRIP NONHPOA (GAUZE/BANDAGES/DRESSINGS) ×1
BENZOIN TINCTURE PRP APPL 2/3 (GAUZE/BANDAGES/DRESSINGS) ×1 IMPLANT
CHLORAPREP W/TINT 26ML (MISCELLANEOUS) ×2 IMPLANT
CLAMP CORD UMBIL (MISCELLANEOUS) IMPLANT
CLOTH BEACON ORANGE TIMEOUT ST (SAFETY) ×2 IMPLANT
DRSG OPSITE POSTOP 4X10 (GAUZE/BANDAGES/DRESSINGS) ×2 IMPLANT
ELECT REM PT RETURN 9FT ADLT (ELECTROSURGICAL) ×2
ELECTRODE REM PT RTRN 9FT ADLT (ELECTROSURGICAL) ×1 IMPLANT
EXTRACTOR VACUUM M CUP 4 TUBE (SUCTIONS) ×1 IMPLANT
GLOVE BIO SURGEON STRL SZ8.5 (GLOVE) ×2 IMPLANT
GLOVE BIOGEL PI IND STRL 7.0 (GLOVE) ×2 IMPLANT
GLOVE BIOGEL PI INDICATOR 7.0 (GLOVE) ×2
GOWN STRL REUS W/TWL 2XL LVL3 (GOWN DISPOSABLE) ×2 IMPLANT
GOWN STRL REUS W/TWL LRG LVL3 (GOWN DISPOSABLE) ×2 IMPLANT
KIT ABG SYR 3ML LUER SLIP (SYRINGE) IMPLANT
NDL HYPO 25X5/8 SAFETYGLIDE (NEEDLE) IMPLANT
NEEDLE HYPO 25X5/8 SAFETYGLIDE (NEEDLE) IMPLANT
NS IRRIG 1000ML POUR BTL (IV SOLUTION) ×2 IMPLANT
PACK C SECTION WH (CUSTOM PROCEDURE TRAY) ×2 IMPLANT
PAD OB MATERNITY 4.3X12.25 (PERSONAL CARE ITEMS) ×2 IMPLANT
PENCIL SMOKE EVAC W/HOLSTER (ELECTROSURGICAL) ×2 IMPLANT
STRIP CLOSURE SKIN 1/2X4 (GAUZE/BANDAGES/DRESSINGS) ×1 IMPLANT
SUT CHROMIC 0 CT 802H (SUTURE) ×2 IMPLANT
SUT CHROMIC 0 MO4 CR (SUTURE) IMPLANT
SUT CHROMIC 1 CTX 36 (SUTURE) ×4 IMPLANT
SUT CHROMIC 2 0 SH (SUTURE) ×2 IMPLANT
SUT GUT PLAIN 0 CT-3 TAN 27 (SUTURE) IMPLANT
SUT MON AB 4-0 PS1 27 (SUTURE) ×2 IMPLANT
SUT PDS AB 0 CTX 36 PDP370T (SUTURE) IMPLANT
SUT VIC AB 0 CT1 18XCR BRD8 (SUTURE) IMPLANT
SUT VIC AB 0 CT1 8-18 (SUTURE)
SUT VIC AB 0 CTX 36 (SUTURE) ×4
SUT VIC AB 0 CTX36XBRD ANBCTRL (SUTURE) ×2 IMPLANT
TOWEL OR 17X24 6PK STRL BLUE (TOWEL DISPOSABLE) ×2 IMPLANT
TRAY FOLEY CATH SILVER 14FR (SET/KITS/TRAYS/PACK) ×2 IMPLANT

## 2015-10-28 NOTE — Transfer of Care (Signed)
Immediate Anesthesia Transfer of Care Note  Patient: Molly Bowers  Procedure(s) Performed: Procedure(s): CESAREAN SECTION (N/A)  Patient Location: PACU  Anesthesia Type:Epidural  Level of Consciousness: awake, alert  and oriented  Airway & Oxygen Therapy: Patient Spontanous Breathing  Post-op Assessment: Report given to RN and Post -op Vital signs reviewed and stable  Post vital signs: Reviewed and stable  Last Vitals:  Filed Vitals:   10/28/15 1830 10/28/15 1839  BP: 108/49 109/44  Pulse: 89 77  Temp:    Resp: 16     Complications: No apparent anesthesia complications

## 2015-10-28 NOTE — Anesthesia Preprocedure Evaluation (Signed)
Anesthesia Evaluation  Patient identified by MRN, date of birth, ID band Patient awake    Reviewed: Allergy & Precautions, NPO status , Patient's Chart, lab work & pertinent test results  History of Anesthesia Complications Negative for: history of anesthetic complications  Airway Mallampati: III  TM Distance: >3 FB Neck ROM: Full    Dental no notable dental hx. (+) Dental Advisory Given   Pulmonary former smoker,    Pulmonary exam normal breath sounds clear to auscultation       Cardiovascular negative cardio ROS Normal cardiovascular exam Rhythm:Regular Rate:Normal     Neuro/Psych negative neurological ROS  negative psych ROS   GI/Hepatic negative GI ROS, Neg liver ROS,   Endo/Other  Morbid obesity  Renal/GU negative Renal ROS  negative genitourinary   Musculoskeletal negative musculoskeletal ROS (+)   Abdominal   Peds negative pediatric ROS (+)  Hematology negative hematology ROS (+)   Anesthesia Other Findings   Reproductive/Obstetrics (+) Pregnancy                             Anesthesia Physical Anesthesia Plan  ASA: III  Anesthesia Plan: Epidural   Post-op Pain Management:    Induction:   Airway Management Planned:   Additional Equipment:   Intra-op Plan:   Post-operative Plan:   Informed Consent: I have reviewed the patients History and Physical, chart, labs and discussed the procedure including the risks, benefits and alternatives for the proposed anesthesia with the patient or authorized representative who has indicated his/her understanding and acceptance.   Dental advisory given  Plan Discussed with: CRNA  Anesthesia Plan Comments:         Anesthesia Quick Evaluation

## 2015-10-28 NOTE — Anesthesia Postprocedure Evaluation (Signed)
Anesthesia Post Note  Patient: Molly Bowers  Procedure(s) Performed: Procedure(s) (LRB): CESAREAN SECTION (N/A)  Patient location during evaluation: PACU Anesthesia Type: Epidural Level of consciousness: awake and alert Pain management: pain level controlled Vital Signs Assessment: post-procedure vital signs reviewed and stable Respiratory status: spontaneous breathing, nonlabored ventilation, respiratory function stable and patient connected to nasal cannula oxygen Cardiovascular status: blood pressure returned to baseline and stable Postop Assessment: no signs of nausea or vomiting Anesthetic complications: no    Last Vitals:  Filed Vitals:   10/28/15 2055 10/28/15 2100  BP:    Pulse: 85 87  Temp:    Resp: 16 18    Last Pain:  Filed Vitals:   10/28/15 2101  PainSc: 0-No pain                 Mele Sylvester L

## 2015-10-28 NOTE — Progress Notes (Signed)
Patient ID: Molly Bowers, female   DOB: April 10, 1991, 25 y.o.   MRN: [id number] Patient started having prolonged variables cervix was 6 cm 90% vertex -2 an IUPC was inserted and amnioinfusion began however after the amnioinfusion started the variables did not  Go  away and she started having late decelerations with each contraction she has given terbutaline 0.25 and contractions are 4 minutes apart mild however the lates   persisted so patient will be delivered by C-section

## 2015-10-28 NOTE — Anesthesia Procedure Notes (Signed)
Epidural Patient location during procedure: OB  Staffing Anesthesiologist: Nyelah Emmerich Performed by: anesthesiologist   Preanesthetic Checklist Completed: patient identified, site marked, surgical consent, pre-op evaluation, timeout performed, IV checked, risks and benefits discussed and monitors and equipment checked  Epidural Patient position: sitting Prep: site prepped and draped and DuraPrep Patient monitoring: continuous pulse ox and blood pressure Approach: midline Location: L3-L4 Injection technique: LOR saline  Needle:  Needle type: Tuohy  Needle gauge: 17 G Needle length: 9 cm and 9 Needle insertion depth: 7 cm Catheter type: closed end flexible Catheter size: 19 Gauge Catheter at skin depth: 12 cm Test dose: negative  Assessment Events: blood not aspirated, injection not painful, no injection resistance, negative IV test and no paresthesia  Additional Notes Patient identified. Risks/Benefits/Options discussed with patient including but not limited to bleeding, infection, nerve damage, paralysis, failed block, incomplete pain control, headache, blood pressure changes, nausea, vomiting, reactions to medication both or allergic, itching and postpartum back pain. Confirmed with bedside nurse the patient's most recent platelet count. Confirmed with patient that they are not currently taking any anticoagulation, have any bleeding history or any family history of bleeding disorders. Patient expressed understanding and wished to proceed. All questions were answered. Sterile technique was used throughout the entire procedure. Please see nursing notes for vital signs. Test dose was given through epidural catheter and negative prior to continuing to dose epidural or start infusion. Warning signs of high block given to the patient including shortness of breath, tingling/numbness in hands, complete motor block, or any concerning symptoms with instructions to call for help. Patient was  given instructions on fall risk and not to get out of bed. All questions and concerns addressed with instructions to call with any issues or inadequate analgesia.    Large keloid present from old epidural placement

## 2015-10-28 NOTE — Op Note (Signed)
Preop diagnosis nonreassuring fetal heart rate tracing in labor Postop diagnosis nuchal cord Surgeon Dr. Francoise CeoBernard Marshall Anesthesia epidural Procedure patient placed on the operating table in the supine position abdomen prepped and draped bladder emptied with a Foley catheter a transverse suprapubic incision made carried   to the rectus fascia fascia cleaned and incised the length of the incision recti muscles retracted laterally peritoneum incised longitudinally transverse incision made in the visceroperitoneum above the bladder bladder mobilized inferiorly transverse lower uterine incision made patient delivered from the Op  position of a female Apgar 8 and 9 nuchal cord the placenta was posterior removed manually and sent to labor and delivery uterine cavity clean with dry laps the uterine incision closed in one layer with continuous suture of #1 chromic hemostasis was satisfactory  Bladder  flap reattached  With 20  chromic uterus well contracted tubes and ovaries normal abdomen closed in layers peritoneum continuous 20  chromic fascia continuous suture of 0 Dexon skin  subcuticular stitch of 4-0 Monocryl blood loss was 900 cc patient tolerated the procedure well

## 2015-10-29 LAB — CBC
HCT: 27.3 % — ABNORMAL LOW (ref 36.0–46.0)
Hemoglobin: 9.2 g/dL — ABNORMAL LOW (ref 12.0–15.0)
MCH: 28.8 pg (ref 26.0–34.0)
MCHC: 33.7 g/dL (ref 30.0–36.0)
MCV: 85.3 fL (ref 78.0–100.0)
PLATELETS: 177 10*3/uL (ref 150–400)
RBC: 3.2 MIL/uL — AB (ref 3.87–5.11)
RDW: 13.9 % (ref 11.5–15.5)
WBC: 21.1 10*3/uL — AB (ref 4.0–10.5)

## 2015-10-29 LAB — RPR: RPR: NONREACTIVE

## 2015-10-29 NOTE — Lactation Note (Signed)
This note was copied from a baby's chart. Lactation Consultation Note  Patient Name: Molly Jeanie CooksDiamond Mcsweeney RUEAV'WToday's Date: 10/29/2015 - mom is an experienced breast feeder - 1st baby 4 months until the baby got teeth.   baby is 3320 hours old and has been to the breast several times, also received 25 ml of EBM early this am .  Baby has only stool x1 . And has voided. Baby sleepy and mom holding baby presently. Attempted to latch while  LC in the room, baby didn't  Latch. LC recommended skin to skin feedings to enhance baby staying awake.  LC encouraged mom to call for feeding assessment - and LC reviewed what a LATCH score is .  Mother informed of post-discharge support and given phone number to the lactation department, including services for phone call assistance; out-patient appointments; and breastfeeding support group. List of other breastfeeding resources in the community given in the handout. Encouraged mother to call for problems or concerns related to breastfeeding.   Maternal Data    Feeding Feeding Type: Breast Fed Length of feed: 20 min  LATCH Score/Interventions Latch: Repeated attempts needed to sustain latch, nipple held in mouth throughout feeding, stimulation needed to elicit sucking reflex. Intervention(s): Adjust position;Assist with latch;Breast massage;Breast compression  Audible Swallowing: A few with stimulation Intervention(s): Skin to skin;Hand expression;Alternate breast massage  Type of Nipple: Everted at rest and after stimulation  Comfort (Breast/Nipple): Soft / non-tender     Hold (Positioning): No assistance needed to correctly position infant at breast.  LATCH Score: 8  Lactation Tools Discussed/Used     Consult Status      Molly Bowers, Molly Bowers 10/29/2015, 3:54 PM

## 2015-10-29 NOTE — Progress Notes (Signed)
Patient ID: Molly Bowers, female   DOB: [birth year], 25 y.o.   MRN: [id number] Postpartum day one Blood pressure 152 pulse 79 respiration 20 Fundus firm Lochia moderate Legs negative doing well

## 2015-10-29 NOTE — Addendum Note (Signed)
Addendum  created 10/29/15 [id number] by Graciela HusbandsWynn O Brandace Cargle, CRNA   Modules edited: Notes Section   Notes Section:  File: [id number]

## 2015-10-29 NOTE — Anesthesia Postprocedure Evaluation (Signed)
Anesthesia Post Note  Patient: Molly Bowers  Procedure(s) Performed: Procedure(s) (LRB): CESAREAN SECTION (N/A)  Patient location during evaluation: Mother Baby Anesthesia Type: Epidural Level of consciousness: awake and alert Pain management: satisfactory to patient Vital Signs Assessment: post-procedure vital signs reviewed and stable Respiratory status: respiratory function stable Cardiovascular status: stable Postop Assessment: no headache, no backache, epidural receding, patient able to bend at knees, no signs of nausea or vomiting and adequate PO intake Anesthetic complications: no Comments: Comfort level was assessed by AnesthesiaTeam and the patient was pleased with the care, interventions, and services provided by the Department of Anesthesia.    Last Vitals:  Filed Vitals:   10/29/15 0030 10/29/15 0430  BP: 107/52 100/52  Pulse: 70 79  Temp: 36.3 C 36.9 C  Resp: 18 20    Last Pain:  Filed Vitals:   10/29/15 0658  PainSc: 3                  Zhane Donlan

## 2015-10-30 MED ORDER — OXYCODONE-ACETAMINOPHEN 5-325 MG PO TABS
1.0000 | ORAL_TABLET | ORAL | Status: DC | PRN
  Administered 2015-10-30 – 2015-10-31 (×2): 2 via ORAL
  Filled 2015-10-30 (×2): qty 2

## 2015-10-30 MED ORDER — RHO D IMMUNE GLOBULIN 1500 UNIT/2ML IJ SOSY
300.0000 ug | PREFILLED_SYRINGE | Freq: Once | INTRAMUSCULAR | Status: AC
  Administered 2015-10-30: 300 ug via INTRAMUSCULAR
  Filled 2015-10-30: qty 2

## 2015-10-30 MED ORDER — OXYCODONE-ACETAMINOPHEN 5-325 MG PO TABS
1.0000 | ORAL_TABLET | ORAL | Status: DC | PRN
  Administered 2015-10-30 (×2): 1 via ORAL
  Filled 2015-10-30 (×2): qty 1

## 2015-10-30 NOTE — Lactation Note (Addendum)
This note was copied from a baby's chart. Lactation Consultation Note  Patient Name: Molly Bowers Drone UJWJX'BToday's Date: 10/30/2015 Reason for consult: Follow-up assessment;Other (Comment) (weight loss only 3 % see LC note )  Mom has been breast and bottle and formula from a bottle ( see information for last feeding)  LC discussed with mom allowing the baby to get hungry and soften the 1st breast and if still hungry offer the 2nd breast .  Allow the baby some tine after feeding so her brain catches up with her stomach, if she settles down , don't supplement. Especially since mom was  Able to pump of 30 ml off the breast. LC encouraged mom to only supplement with her milk . She will probably notice less spitting.  Per mom worried the baby isn't getting enough to feed. Reassured mom weight loss is low and she is able to express her milk if needed . Keep it simple.  LC discussed prevention of engorgement also encouraged to hold off on pacifier use until after the 3 rd week growth spurt.     Maternal Data    Feeding Feeding Type:  (per mom the baby recently breast fed 15 mins and supplemented 30 ml of EBM and 15 ml of formula and the baby spit up afterwards )  LATCH Score/Interventions                      Lactation Tools Discussed/Used     Consult Status Consult Status: Follow-up Date: 10/31/15 Follow-up type: In-patient    Kathrin Greathouseorio, Valentin Benney Ann 10/30/2015, 3:45 PM

## 2015-10-30 NOTE — Progress Notes (Signed)
Patient ID: Molly Bowers, female   DOB: 1990/08/09, 25 y.o.   MRN: [id number] Blood pressure 94/54 pulse 56 respiration 18 Patient has no complaints doing well

## 2015-10-31 ENCOUNTER — Encounter (HOSPITAL_COMMUNITY): Payer: Self-pay | Admitting: Obstetrics

## 2015-10-31 LAB — RH IG WORKUP (INCLUDES ABO/RH)
ABO/RH(D): B NEG
Fetal Screen: NEGATIVE
Gestational Age(Wks): 40
Unit division: 0

## 2015-10-31 LAB — BIRTH TISSUE RECOVERY COLLECTION (PLACENTA DONATION)

## 2015-10-31 NOTE — H&P (Signed)
This is Dr. Francoise CeoBernard Tamicka Shimon dictating the history and physical on  Molly Bowers  she's a 25 year old gravida 2 para 1001 at 40 weeks and a day negative GBS was admitted for induction cervix was 1 cm 70% vertex -3 and she was started on low-dose Pitocin Past medical history negative Past surgical history negative except for previously System review negative Physical exam well-developed female not in labor HEENT negative Lungs clear to P&A Heart regular rhythm no murmurs or gallops Breasts negative Abdomen term Pelvic as described above Extremities negative

## 2015-10-31 NOTE — Progress Notes (Signed)
Pt discharged at 0805 to home with baby. Discharge information given, pt has no questions or concerns. Walked to car with nurse.

## 2015-10-31 NOTE — Progress Notes (Signed)
Patient ID: Molly Bowers, female   DOB: September 09, 1990, 25 y.o.   MRN: [id number] Postop day 3 Blood pressure 07/13/1956 pulse 70 respiration 18 Abdomen soft Incision clean Legs negative doing well home today

## 2015-10-31 NOTE — Discharge Instructions (Signed)
Discharge instructions   You can wash your hair  Shower  Eat what you want  Drink what you want  See me in 6 weeks  Your ankles are going to swell more in the next 2 weeks than when pregnant  No sex for 6 weeks   Kalief Kattner A, MD 10/31/2015

## 2015-10-31 NOTE — Discharge Summary (Signed)
Obstetric Discharge Summary Reason for Admission: induction of labor Prenatal Procedures: none Intrapartum Procedures: cesarean: low cervical, transverse Postpartum Procedures: none Complications-Operative and Postpartum: none HEMOGLOBIN  Date Value Ref Range Status  10/29/2015 9.2* 12.0 - 15.0 g/dL Final   HCT  Date Value Ref Range Status  10/29/2015 27.3* 36.0 - 46.0 % Final    Physical Exam:  General: alert Lochia: appropriate Uterine Fundus: firm Incision: healing well DVT Evaluation: No evidence of DVT seen on physical exam.  Discharge Diagnoses: Term Pregnancy-delivered  Discharge Information: Date: 10/31/2015 Activity: pelvic rest Diet: routine Medications: Percocet Condition: improved Instructions: refer to practice specific booklet Discharge to: home Follow-up Information    Follow up with Kathreen CosierMARSHALL,Brion Sossamon A, MD.   Specialty:  Obstetrics and Gynecology   Contact information:   [address] [place] [address] [phone]       Newborn Data: Live born female  Birth Weight: 7 lb 4.9 oz (3315 g) APGAR: 8, 9  Home with mother.  Kaydin Labo A 10/31/2015, 6:44 AM

## 2015-11-01 LAB — TYPE AND SCREEN
ABO/RH(D): B NEG
Antibody Screen: POSITIVE
DAT, IgG: NEGATIVE
UNIT DIVISION: 0
Unit division: 0

## 2015-11-02 ENCOUNTER — Encounter (HOSPITAL_COMMUNITY): Payer: Self-pay | Admitting: Obstetrics

## 2015-11-02 NOTE — Addendum Note (Signed)
Addendum  created 11/02/15 1042 by Ronelle Nighharles Deaundra Kutzer, MD   Modules edited: Anesthesia Events, Narrator   Narrator:  Narrator: Event Log Edited

## 2015-11-07 NOTE — Progress Notes (Signed)
Post discharge chart review completed.  

## 2015-11-22 ENCOUNTER — Inpatient Hospital Stay (HOSPITAL_COMMUNITY)
Admission: AD | Admit: 2015-11-22 | Discharge: 2015-11-22 | Disposition: A | Discharge status: Home / Self Care | Payer: Medicaid Other | Source: Ambulatory Visit | Attending: Obstetrics | Admitting: Obstetrics

## 2015-11-22 ENCOUNTER — Encounter (HOSPITAL_COMMUNITY): Payer: Self-pay | Admitting: *Deleted

## 2015-11-22 DIAGNOSIS — T814XXA Infection following a procedure, initial encounter: Secondary | ICD-10-CM

## 2015-11-22 DIAGNOSIS — O860 Infection of obstetric surgical wound: Secondary | ICD-10-CM | POA: Diagnosis not present

## 2015-11-22 DIAGNOSIS — Z87891 Personal history of nicotine dependence: Secondary | ICD-10-CM | POA: Diagnosis not present

## 2015-11-22 DIAGNOSIS — Z880 Allergy status to penicillin: Secondary | ICD-10-CM | POA: Insufficient documentation

## 2015-11-22 DIAGNOSIS — O9089 Other complications of the puerperium, not elsewhere classified: Secondary | ICD-10-CM | POA: Diagnosis present

## 2015-11-22 DIAGNOSIS — IMO0001 Reserved for inherently not codable concepts without codable children: Secondary | ICD-10-CM

## 2015-11-22 HISTORY — DX: Unspecified ovarian cyst, unspecified side: N83.209

## 2015-11-22 HISTORY — DX: Headache: R51

## 2015-11-22 HISTORY — DX: Headache, unspecified: R51.9

## 2015-11-22 HISTORY — DX: Anxiety disorder, unspecified: F41.9

## 2015-11-22 MED ORDER — CEPHALEXIN 500 MG PO CAPS: 500.0000 mg | ORAL_CAPSULE | Freq: Four times a day (QID) | ORAL | Status: AC

## 2015-11-22 NOTE — MAU Provider Note (Signed)
History     CSN: [id number]  Arrival date and time: 11/22/15 0807   First Provider Initiated Contact with Patient 11/22/15 [phone]      Chief Complaint  Patient presents with  . incision discomfort    HPI  Molly Bowers 24 y.o. [id number] presents to MAU with complaint of "pus" coming out of the right side of her incision. She had a C/S 3 weeks ago. She denies fever, pain, swelling at incision site  Past Medical History  Diagnosis Date  . Medical history non-contributory   . Headache   . Ovarian cyst   . Anxiety     Past Surgical History  Procedure Laterality Date  . Keloid excision    . Cesarean section N/A 10/28/2015    Procedure: CESAREAN SECTION;  Surgeon: Kathreen CosierBernard A Marshall, MD;  Location: [facility];  Service: Obstetrics;  Laterality: N/A;    Family History  Problem Relation Age of Onset  . Diabetes Mother   . Hypertension Mother   . Cancer Maternal Grandmother     brain tumor, died during surgery  . Cancer Paternal Grandfather     prostate    Social History  Substance Use Topics  . Smoking status: Former Smoker    Types: Cigarettes    Quit date: 12/24/2014  . Smokeless tobacco: Never Used  . Alcohol Use: Yes     Comment: occ    Allergies:  Allergies  Allergen Reactions  . Peanut-Containing Drug Products Anaphylaxis  . Phenylephrine Hives  . Vancomycin Itching  . Penicillins Hives and Rash    Has patient had a PCN reaction causing immediate rash, facial/tongue/throat swelling, SOB or lightheadedness with hypotension:NO Has patient had a PCN reaction causing severe rash involving mucus membranes or skin necrosis:NO Has patient had a PCN reaction that required hospitalization NO Has patient had a PCN reaction occurring within the last 10 years: NO If all of the above answers are "NO", then may proceed with Cephalosporin use.     No prescriptions prior to admission    Review of Systems  Constitutional: Negative for fever.  Skin:       Pus coming  out of incision and odor  All other systems reviewed and are negative.  Physical Exam   Blood pressure 115/65, pulse 63, temperature 98.1 F (36.7 C), temperature source Oral, resp. rate 16, currently breastfeeding.  Physical Exam  Nursing note and vitals reviewed. Constitutional: She is oriented to person, place, and time. She appears well-developed and well-nourished.  HENT:  Head: Normocephalic and atraumatic.  Cardiovascular: Normal rate.   Respiratory: Effort normal and breath sounds normal. No respiratory distress.  GI: Soft. Bowel sounds are normal.  Musculoskeletal: Normal range of motion.  Neurological: She is alert and oriented to person, place, and time.  Skin: Skin is warm and dry.  Incision intact, slight redness at right corner of incision and small amount clear drainage when pressed.  Psychiatric: She has a normal mood and affect. Her behavior is normal. Judgment and thought content normal.    MAU Course  Procedures  MDM On exam pt's incision looks good  Except for the far right edge where a small amount of drainage is noted when I palpated. There is an odor but incision looks clean and intact. Will give PO ABX and have her go back to Dr. Gaynell FaceMarshall for wound check. Pt states her allergy to PCN was only rash and told pt to discontinue ABX and call the office if rash occurs. Assessment  and Plan  Post Operative Wound Infection  Keflex 500 mg QID X 10 days  Call Office and schedule Wound Recheck  Discharge    Molly Bowers 11/22/2015, 8:56 AM

## 2015-11-22 NOTE — MAU Note (Signed)
Irritation, drainage and odor- ongoing problem. Washes several times a day, odor persists

## 2015-11-22 NOTE — MAU Note (Signed)
C-section three weeks ago.  Has some brown discharge with an odor coming from her incision and it is very uncomfortable and burns.  Has some numbness around the incision site as well.

## 2015-11-22 NOTE — Discharge Instructions (Signed)
Surgical Site Infections FAQs  What is a Surgical Site Infection (SSI)?  A surgical site infection is an infection that occurs after surgery in the part of the body where the surgery took place. Most patients who have surgery do not develop an infection. However, infections develop in about 1 to 3 out of every 100 patients who have surgery.  Some of the common symptoms of a surgical site infection are:  · Redness and pain around the area where you had surgery  · Drainage of cloudy fluid from your surgical wound  · Fever  Can SSIs be treated?  Yes. Most surgical site infections can be treated with antibiotics. The antibiotic given to you depends on the bacteria (germs) causing the infection. Sometimes patients with SSIs also need another surgery to treat the infection.  What are some of the things that hospitals are doing to prevent SSIs?  To prevent SSIs, doctors, nurses, and other healthcare providers:  · Clean their hands and arms up to their elbows with an antiseptic agent just before the surgery.  · Clean their hands with soap and water or an alcohol-based hand rub before and after caring for each patient.  · May remove some of your hair immediately before your surgery using electric clippers if the hair is in the same area where the procedure will occur. They should not shave you with a razor.  · Wear special hair covers, masks, gowns, and gloves during surgery to keep the surgery area clean.  · Give you antibiotics before your surgery starts. In most cases, you should get antibiotics within 60 minutes before the surgery starts and the antibiotics should be stopped within 24 hours after surgery.  · Clean the skin at the site of your surgery with a special soap that kills germs.  What can I do to help prevent SSIs?  Before your surgery:  · Tell your doctor about other medical problems you may have. Health problems such as allergies, diabetes, and obesity could affect your surgery and your treatment.  · Quit  smoking. Patients who smoke get more infections. Talk to your doctor about how you can quit before your surgery.  · Do not shave near where you will have surgery. Shaving with a razor can irritate your skin and make it easier to develop an infection.  At the time of your surgery:  · Speak up if someone tries to shave you with a razor before surgery. Ask why you need to be shaved and talk with your surgeon if you have any concerns.  · Ask if you will get antibiotics before surgery.  After your surgery:  · Make sure that your healthcare providers clean their hands before examining you, either with soap and water or an alcohol-based hand rub.    If you do not see your providers clean their hands, please ask them to do so.  · Family and friends who visit you should not touch the surgical wound or dressings.  · Family and friends should clean their hands with soap and water or an alcohol-based hand rub before and after visiting you. If you do not see them clean their hands, ask them to clean their hands.  What do I need to do when I go home from the hospital?  · Before you go home, your doctor or nurse should explain everything you need to know about taking care of your wound. Make sure you understand how to care for your wound before you leave the   hospital.  · Always clean your hands before and after caring for your wound.  · Before you go home, make sure you know who to contact if you have questions or problems after you get home.  · If you have any symptoms of an infection, such as redness and pain at the surgery site, drainage, or fever, call your doctor immediately.  If you have additional questions, please ask your doctor or nurse.  Developed and co-sponsored by The Society for Healthcare Epidemiology of America (SHEA); Infectious Diseases Society of America (IDSA); American Hospital Association; Association for Professionals in Infection Control and Epidemiology (APIC); Centers for Disease Control and Prevention  (CDC); and The Joint Commission.     This information is not intended to replace advice given to you by your health care provider. Make sure you discuss any questions you have with your health care provider.     Document Released: 06/30/2013 Document Revised: 07/16/2014 Document Reviewed: 09/08/2014  Elsevier Interactive Patient Education ©2016 Elsevier Inc.

## 2016-06-03 ENCOUNTER — Encounter (HOSPITAL_COMMUNITY): Payer: Self-pay | Admitting: *Deleted

## 2016-06-03 ENCOUNTER — Inpatient Hospital Stay (HOSPITAL_COMMUNITY)
Admission: AD | Admit: 2016-06-03 | Discharge: 2016-06-03 | Disposition: A | Discharge status: Home / Self Care | Payer: Medicaid Other | Source: Ambulatory Visit | Attending: Obstetrics & Gynecology | Admitting: Obstetrics & Gynecology

## 2016-06-03 DIAGNOSIS — N912 Amenorrhea, unspecified: Secondary | ICD-10-CM | POA: Insufficient documentation

## 2016-06-03 DIAGNOSIS — N911 Secondary amenorrhea: Secondary | ICD-10-CM

## 2016-06-03 NOTE — MAU Note (Signed)
Pt reports she had a positive home preg test and wants to verify.

## 2016-06-03 NOTE — MAU Provider Note (Signed)
[name] is a 25 y.o. G2P2001 at Unknown who presents to MAU today for pregnancy verification. The patient denies abdominal pain or vaginal bleeding today.   BP 112/66 (BP Location: Right Arm)   Pulse 83   Temp 98.5 F (36.9 C) (Oral)   Resp 17   LMP 04/29/2016   SpO2 100%   CONSTITUTIONAL: Well-developed, well-nourished female in no acute distress.  CARDIOVASCULAR: Regular heart rate RESPIRATORY: Normal effort NEUROLOGICAL: Alert and oriented to person, place, and time.  SKIN: Skin is warm and dry. No rash noted. Not diaphoretic. No erythema. No pallor. PSYCH: Normal mood and affect. Normal behavior. Normal judgment and thought content.  MDM Medical Screen Exam Complete  A: Amenorrhea   P: Discharge from MAU Patient advised to follow-up with [facility] or office of her choice for pregnancy confirmation Pt given list of providers, [place] Monday-Thursday 8am-4pm or Friday 8am-11am Patient may return to MAU as needed or if her condition were to change or worsen   Hurshel PartyLisa A Leftwich-Kirby, CNM  06/03/2016 8:41 PM

## 2016-06-15 ENCOUNTER — Encounter: Payer: Self-pay | Admitting: Family

## 2016-06-15 ENCOUNTER — Other Ambulatory Visit (HOSPITAL_COMMUNITY)
Admission: RE | Admit: 2016-06-15 | Discharge: 2016-06-15 | Disposition: A | Discharge status: Home / Self Care | Payer: Medicaid Other | Source: Ambulatory Visit | Attending: Family | Admitting: Family

## 2016-06-15 ENCOUNTER — Ambulatory Visit (INDEPENDENT_AMBULATORY_CARE_PROVIDER_SITE_OTHER): Payer: Self-pay | Admitting: Family

## 2016-06-15 VITALS — BP 97/62 | Wt 237.0 lb

## 2016-06-15 DIAGNOSIS — Z01419 Encounter for gynecological examination (general) (routine) without abnormal findings: Secondary | ICD-10-CM | POA: Insufficient documentation

## 2016-06-15 DIAGNOSIS — Z2839 Other underimmunization status: Secondary | ICD-10-CM

## 2016-06-15 DIAGNOSIS — O9989 Other specified diseases and conditions complicating pregnancy, childbirth and the puerperium: Secondary | ICD-10-CM

## 2016-06-15 DIAGNOSIS — Z283 Underimmunization status: Secondary | ICD-10-CM

## 2016-06-15 DIAGNOSIS — Z3A01 Less than 8 weeks gestation of pregnancy: Secondary | ICD-10-CM

## 2016-06-15 DIAGNOSIS — O09899 Supervision of other high risk pregnancies, unspecified trimester: Secondary | ICD-10-CM

## 2016-06-15 DIAGNOSIS — Z113 Encounter for screening for infections with a predominantly sexual mode of transmission: Secondary | ICD-10-CM | POA: Insufficient documentation

## 2016-06-15 DIAGNOSIS — Z3490 Encounter for supervision of normal pregnancy, unspecified, unspecified trimester: Secondary | ICD-10-CM | POA: Insufficient documentation

## 2016-06-15 DIAGNOSIS — Z3481 Encounter for supervision of other normal pregnancy, first trimester: Secondary | ICD-10-CM

## 2016-06-15 DIAGNOSIS — Z3480 Encounter for supervision of other normal pregnancy, unspecified trimester: Secondary | ICD-10-CM

## 2016-06-15 DIAGNOSIS — O34219 Maternal care for unspecified type scar from previous cesarean delivery: Secondary | ICD-10-CM | POA: Insufficient documentation

## 2016-06-15 DIAGNOSIS — Z3689 Encounter for other specified antenatal screening: Secondary | ICD-10-CM

## 2016-06-15 LAB — GLUCOSE, RANDOM: Glucose, Bld: 84 mg/dL (ref 65–99)

## 2016-06-15 MED ORDER — BUTENAFINE HCL 1 % EX CREA: 1.0000 | TOPICAL_CREAM | Freq: Two times a day (BID) | CUTANEOUS | 0 refills | Status: AC

## 2016-06-15 NOTE — Progress Notes (Signed)
Bedside U/S shows IUP with FHT of 121 BPM and CRL is 4.265mm  GA is 3356w1d

## 2016-06-15 NOTE — Patient Instructions (Addendum)
Lotrimin Cream    First Trimester of Pregnancy The first trimester of pregnancy is from week 1 until the end of week 12 (months 1 through 3). A week after a sperm fertilizes an egg, the egg will implant on the wall of the uterus. This embryo will begin to develop into a baby. Genes from you and your partner are forming the baby. The female genes determine whether the baby is a boy or a girl. At 6-8 weeks, the eyes and face are formed, and the heartbeat can be seen on ultrasound. At the end of 12 weeks, all the baby's organs are formed.  Now that you are pregnant, you will want to do everything you can to have a healthy baby. Two of the most important things are to get good prenatal care and to follow your health care provider's instructions. Prenatal care is all the medical care you receive before the baby's birth. This care will help prevent, find, and treat any problems during the pregnancy and childbirth. BODY CHANGES Your body goes through many changes during pregnancy. The changes vary from woman to woman.   You may gain or lose a couple of pounds at first.  You may feel sick to your stomach (nauseous) and throw up (vomit). If the vomiting is uncontrollable, call your health care provider.  You may tire easily.  You may develop headaches that can be relieved by medicines approved by your health care provider.  You may urinate more often. Painful urination may mean you have a bladder infection.  You may develop heartburn as a result of your pregnancy.  You may develop constipation because certain hormones are causing the muscles that push waste through your intestines to slow down.  You may develop hemorrhoids or swollen, bulging veins (varicose veins).  Your breasts may begin to grow larger and become tender. Your nipples may stick out more, and the tissue that surrounds them (areola) may become darker.  Your gums may bleed and may be sensitive to brushing and flossing.  Dark spots  or blotches (chloasma, mask of pregnancy) may develop on your face. This will likely fade after the baby is born.  Your menstrual periods will stop.  You may have a loss of appetite.  You may develop cravings for certain kinds of food.  You may have changes in your emotions from day to day, such as being excited to be pregnant or being concerned that something may go wrong with the pregnancy and baby.  You may have more vivid and strange dreams.  You may have changes in your hair. These can include thickening of your hair, rapid growth, and changes in texture. Some women also have hair loss during or after pregnancy, or hair that feels dry or thin. Your hair will most likely return to normal after your baby is born. WHAT TO EXPECT AT YOUR PRENATAL VISITS During a routine prenatal visit:  You will be weighed to make sure you and the baby are growing normally.  Your blood pressure will be taken.  Your abdomen will be measured to track your baby's growth.  The fetal heartbeat will be listened to starting around week 10 or 12 of your pregnancy.  Test results from any previous visits will be discussed. Your health care provider may ask you:  How you are feeling.  If you are feeling the baby move.  If you have had any abnormal symptoms, such as leaking fluid, bleeding, severe headaches, or abdominal cramping.  If you  are using any tobacco products, including cigarettes, chewing tobacco, and electronic cigarettes.  If you have any questions. Other tests that may be performed during your first trimester include:  Blood tests to find your blood type and to check for the presence of any previous infections. They will also be used to check for low iron levels (anemia) and Rh antibodies. Later in the pregnancy, blood tests for diabetes will be done along with other tests if problems develop.  Urine tests to check for infections, diabetes, or protein in the urine.  An ultrasound to  confirm the proper growth and development of the baby.  An amniocentesis to check for possible genetic problems.  Fetal screens for spina bifida and Down syndrome.  You may need other tests to make sure you and the baby are doing well.  HIV (human immunodeficiency virus) testing. Routine prenatal testing includes screening for HIV, unless you choose not to have this test. HOME CARE INSTRUCTIONS  Medicines   Follow your health care provider's instructions regarding medicine use. Specific medicines may be either safe or unsafe to take during pregnancy.  Take your prenatal vitamins as directed.  If you develop constipation, try taking a stool softener if your health care provider approves. Diet   Eat regular, well-balanced meals. Choose a variety of foods, such as meat or vegetable-based protein, fish, milk and low-fat dairy products, vegetables, fruits, and whole grain breads and cereals. Your health care provider will help you determine the amount of weight gain that is right for you.  Avoid raw meat and uncooked cheese. These carry germs that can cause birth defects in the baby.  Eating four or five small meals rather than three large meals a day may help relieve nausea and vomiting. If you start to feel nauseous, eating a few soda crackers can be helpful. Drinking liquids between meals instead of during meals also seems to help nausea and vomiting.  If you develop constipation, eat more high-fiber foods, such as fresh vegetables or fruit and whole grains. Drink enough fluids to keep your urine clear or pale yellow. Activity and Exercise   Exercise only as directed by your health care provider. Exercising will help you:  Control your weight.  Stay in shape.  Be prepared for labor and delivery.  Experiencing pain or cramping in the lower abdomen or low back is a good sign that you should stop exercising. Check with your health care provider before continuing normal  exercises.  Try to avoid standing for long periods of time. Move your legs often if you must stand in one place for a long time.  Avoid heavy lifting.  Wear low-heeled shoes, and practice good posture.  You may continue to have sex unless your health care provider directs you otherwise. Relief of Pain or Discomfort   Wear a good support bra for breast tenderness.   Take warm sitz baths to soothe any pain or discomfort caused by hemorrhoids. Use hemorrhoid cream if your health care provider approves.   Rest with your legs elevated if you have leg cramps or low back pain.  If you develop varicose veins in your legs, wear support hose. Elevate your feet for 15 minutes, 3-4 times a day. Limit salt in your diet. Prenatal Care   Schedule your prenatal visits by the twelfth week of pregnancy. They are usually scheduled monthly at first, then more often in the last 2 months before delivery.  Write down your questions. Take them to your prenatal visits.  Keep all your prenatal visits as directed by your health care provider. Safety   Wear your seat belt at all times when driving.  Make a list of emergency phone numbers, including numbers for family, friends, the hospital, and police and fire departments. General Tips   Ask your health care provider for a referral to a local prenatal education class. Begin classes no later than at the beginning of month 6 of your pregnancy.  Ask for help if you have counseling or nutritional needs during pregnancy. Your health care provider can offer advice or refer you to specialists for help with various needs.  Do not use hot tubs, steam rooms, or saunas.  Do not douche or use tampons or scented sanitary pads.  Do not cross your legs for long periods of time.  Avoid cat litter boxes and soil used by cats. These carry germs that can cause birth defects in the baby and possibly loss of the fetus by miscarriage or stillbirth.  Avoid all smoking,  herbs, alcohol, and medicines not prescribed by your health care provider. Chemicals in these affect the formation and growth of the baby.  Do not use any tobacco products, including cigarettes, chewing tobacco, and electronic cigarettes. If you need help quitting, ask your health care provider. You may receive counseling support and other resources to help you quit.  Schedule a dentist appointment. At home, brush your teeth with a soft toothbrush and be gentle when you floss. SEEK MEDICAL CARE IF:   You have dizziness.  You have mild pelvic cramps, pelvic pressure, or nagging pain in the abdominal area.  You have persistent nausea, vomiting, or diarrhea.  You have a bad smelling vaginal discharge.  You have pain with urination.  You notice increased swelling in your face, hands, legs, or ankles. SEEK IMMEDIATE MEDICAL CARE IF:   You have a fever.  You are leaking fluid from your vagina.  You have spotting or bleeding from your vagina.  You have severe abdominal cramping or pain.  You have rapid weight gain or loss.  You vomit blood or material that looks like coffee grounds.  You are exposed to [place] measles and have never had them.  You are exposed to fifth disease or chickenpox.  You develop a severe headache.  You have shortness of breath.  You have any kind of trauma, such as from a fall or a car accident. This information is not intended to replace advice given to you by your health care provider. Make sure you discuss any questions you have with your health care provider. Document Released: 06/19/2001 Document Revised: 07/16/2014 Document Reviewed: 05/05/2013 Elsevier Interactive Patient Education  2017 [employer]

## 2016-06-16 LAB — PAIN MGMT, PROFILE 6 CONF W/O MM, U
6 Acetylmorphine: NEGATIVE ng/mL (ref <10)
AMPHETAMINES: NEGATIVE ng/mL (ref <500)
Alcohol Metabolites: NEGATIVE ng/mL (ref <500)
BARBITURATES: NEGATIVE ng/mL (ref <300)
BENZODIAZEPINES: NEGATIVE ng/mL (ref <100)
COCAINE METABOLITE: NEGATIVE ng/mL (ref <150)
METHADONE METABOLITE: NEGATIVE ng/mL (ref <100)
Marijuana Metabolite: NEGATIVE ng/mL (ref <20)
OPIATES: NEGATIVE ng/mL (ref <100)
OXYCODONE: NEGATIVE ng/mL (ref <100)
Oxidant: NEGATIVE ug/mL (ref <200)
PHENCYCLIDINE: NEGATIVE ng/mL (ref <25)
Please note:: 0
pH: 6.69 (ref 4.5–9.0)

## 2016-06-16 LAB — HEMOGLOBIN A1C
Hgb A1c MFr Bld: 4.6 % (ref <5.7)
Mean Plasma Glucose: 85 mg/dL

## 2016-06-16 LAB — SICKLE CELL SCREEN: SICKLE CELL SCREEN: NEGATIVE

## 2016-06-16 NOTE — Progress Notes (Signed)
  Subjective:    Molly Bowers is being seen today for her first obstetrical visit.  This is not a planned pregnancy. She is at 3131w6d gestation. Her obstetrical history is significant for recent delivery in April 2017 by urgent csection due to fetal distress. Relationship with FOB: spouse, living together. Patient does intend to breast feed. Pregnancy history fully reviewed.  Patient reports itching and odor at incision site.  Review of Systems:   Review of Systems  Constitutional: Positive for fatigue.  Gastrointestinal: Positive for nausea. Negative for vomiting.  Genitourinary: Negative for pelvic pain and vaginal bleeding.  Neurological: Negative for dizziness.    Objective:     BP 97/62   Wt 237 lb (107.5 kg)   LMP 04/29/2016 (Exact Date)   BMI 38.84 kg/m  Physical Exam  Exam  BP 97/62   Wt 237 lb (107.5 kg)   LMP 04/29/2016 (Exact Date)   BMI 38.84 kg/m  Uterine Size: size equals dates  Pelvic Exam:    Perineum: No Hemorrhoids, Normal Perineum   Vulva: normal   Vagina:  normal mucosa, normal discharge, no palpable nodules   pH: Not done   Cervix: no bleeding following Pap, no cervical motion tenderness and no lesions   Adnexa: normal adnexa and no mass, fullness, tenderness   Bony Pelvis: Adequate  System: Breast:  No nipple retraction or dimpling, No nipple discharge or bleeding, No axillary or supraclavicular adenopathy, Normal to palpation without dominant masses   Skin: normal coloration and turgor, no rashes    Neurologic: negative   Extremities: normal strength, tone, and muscle mass   HEENT neck supple with midline trachea and thyroid without masses   Mouth/Teeth mucous membranes moist, pharynx normal without lesions   Neck supple and no masses   Cardiovascular: regular rate and rhythm, no murmurs or gallops   Respiratory:  appears well, vitals normal, no respiratory distress, acyanotic, normal RR, neck free of mass or lymphadenopathy, chest clear, no  wheezing, crepitations, rhonchi, normal symmetric air entry   Abdomen: soft, non-tender; bowel sounds normal; no masses,  no organomegaly; site of prior csection slightly red   Urinary: urethral meatus normal     Assessment:    Pregnancy: [id number] Patient Active Problem List   Diagnosis Date Noted  . Encounter for supervision of normal pregnancy, antepartum 06/15/2016  . Pregnancy with history of cesarean section, antepartum 06/15/2016  . S/P cesarean section 10/28/2015       Plan:     Initial labs drawn. Pap smear obtained Prenatal vitamins. Discussed potential for fungal infection in dark, moist folds in skin; recommended thorough drying of lower abdomen at site of prior csection; apply Lotrimin Problem list reviewed and updated. Genetic testing discussed: ordered First Screen. Follow up in 4 weeks.   Marlis EdelsonKARIM, Izzac Rockett N 06/16/2016

## 2016-06-17 LAB — CULTURE, OB URINE
COLONY COUNT: NO GROWTH
ORGANISM ID, BACTERIA: NO GROWTH

## 2016-06-18 LAB — GC/CHLAMYDIA PROBE AMP (~~LOC~~) NOT AT ARMC
Chlamydia: NEGATIVE
Neisseria Gonorrhea: NEGATIVE

## 2016-06-19 LAB — PRENATAL PROFILE (SOLSTAS)
ANTIBODY SCREEN: NEGATIVE
BASOS PCT: 0 %
Basophils Absolute: 0 cells/uL (ref 0–200)
EOS ABS: 84 {cells}/uL (ref 15–500)
Eosinophils Relative: 1 %
HEMATOCRIT: 35.5 % (ref 35.0–45.0)
HIV: NONREACTIVE
Hemoglobin: 11.5 g/dL — ABNORMAL LOW (ref 11.7–15.5)
Hepatitis B Surface Ag: NEGATIVE
Lymphocytes Relative: 21 %
Lymphs Abs: 1764 cells/uL (ref 850–3900)
MCH: 27.8 pg (ref 27.0–33.0)
MCHC: 32.4 g/dL (ref 32.0–36.0)
MCV: 85.7 fL (ref 80.0–100.0)
MONO ABS: 588 {cells}/uL (ref 200–950)
MPV: 10.7 fL (ref 7.5–12.5)
Monocytes Relative: 7 %
NEUTROS ABS: 5964 {cells}/uL (ref 1500–7800)
Neutrophils Relative %: 71 %
PLATELETS: 226 10*3/uL (ref 140–400)
RBC: 4.14 MIL/uL (ref 3.80–5.10)
RDW: 14 % (ref 11.0–15.0)
Rh Type: NEGATIVE
Rubella: <0.9 Index (ref <0.90)
WBC: 8.4 10*3/uL (ref 3.8–10.8)

## 2016-06-19 LAB — CYTOLOGY - PAP: Diagnosis: NEGATIVE

## 2016-06-21 LAB — CYSTIC FIBROSIS DIAGNOSTIC STUDY

## 2016-06-24 DIAGNOSIS — Z283 Underimmunization status: Secondary | ICD-10-CM | POA: Insufficient documentation

## 2016-06-24 DIAGNOSIS — O9989 Other specified diseases and conditions complicating pregnancy, childbirth and the puerperium: Secondary | ICD-10-CM

## 2016-06-24 DIAGNOSIS — O99891 Other specified diseases and conditions complicating pregnancy: Secondary | ICD-10-CM | POA: Insufficient documentation

## 2016-06-24 DIAGNOSIS — Z2839 Other underimmunization status: Secondary | ICD-10-CM | POA: Insufficient documentation

## 2016-07-13 ENCOUNTER — Encounter: Payer: Medicaid Other | Admitting: Family

## 2016-07-13 ENCOUNTER — Encounter (HOSPITAL_COMMUNITY): Payer: Self-pay | Admitting: Family

## 2016-07-25 ENCOUNTER — Ambulatory Visit (HOSPITAL_COMMUNITY): Payer: Medicaid Other

## 2016-08-01 ENCOUNTER — Encounter: Payer: Self-pay | Admitting: *Deleted

## 2016-08-01 ENCOUNTER — Ambulatory Visit (HOSPITAL_COMMUNITY): Payer: Medicaid Other | Attending: Family

## 2016-08-01 ENCOUNTER — Ambulatory Visit (HOSPITAL_COMMUNITY): Admission: RE | Admit: 2016-08-01 | Payer: Medicaid Other | Source: Ambulatory Visit

## 2016-08-03 ENCOUNTER — Inpatient Hospital Stay (HOSPITAL_COMMUNITY): Payer: Self-pay | Admitting: Anesthesiology

## 2016-08-03 ENCOUNTER — Inpatient Hospital Stay (HOSPITAL_COMMUNITY): Payer: Self-pay

## 2016-08-03 ENCOUNTER — Ambulatory Visit (HOSPITAL_COMMUNITY)
Admission: AD | Admit: 2016-08-03 | Discharge: 2016-08-04 | Disposition: A | Discharge status: Home / Self Care | Payer: Self-pay | Source: Ambulatory Visit | Attending: Obstetrics & Gynecology | Admitting: Obstetrics & Gynecology

## 2016-08-03 ENCOUNTER — Encounter (HOSPITAL_COMMUNITY): Payer: Self-pay | Admitting: Obstetrics & Gynecology

## 2016-08-03 ENCOUNTER — Encounter (HOSPITAL_COMMUNITY)
Admission: AD | Disposition: A | Discharge status: Home / Self Care | Payer: Self-pay | Source: Ambulatory Visit | Attending: Obstetrics & Gynecology

## 2016-08-03 DIAGNOSIS — O034 Incomplete spontaneous abortion without complication: Secondary | ICD-10-CM | POA: Diagnosis present

## 2016-08-03 DIAGNOSIS — O021 Missed abortion: Secondary | ICD-10-CM | POA: Insufficient documentation

## 2016-08-03 DIAGNOSIS — Z87891 Personal history of nicotine dependence: Secondary | ICD-10-CM | POA: Insufficient documentation

## 2016-08-03 DIAGNOSIS — F419 Anxiety disorder, unspecified: Secondary | ICD-10-CM | POA: Insufficient documentation

## 2016-08-03 DIAGNOSIS — N83209 Unspecified ovarian cyst, unspecified side: Secondary | ICD-10-CM | POA: Insufficient documentation

## 2016-08-03 HISTORY — PX: DILATION AND EVACUATION: SHX1459

## 2016-08-03 LAB — CBC
HCT: 36 % (ref 36.0–46.0)
HEMOGLOBIN: 12.3 g/dL (ref 12.0–15.0)
MCH: 29.1 pg (ref 26.0–34.0)
MCHC: 34.2 g/dL (ref 30.0–36.0)
MCV: 85.1 fL (ref 78.0–100.0)
Platelets: 194 10*3/uL (ref 150–400)
RBC: 4.23 MIL/uL (ref 3.87–5.11)
RDW: 14.4 % (ref 11.5–15.5)
WBC: 17.7 10*3/uL — AB (ref 4.0–10.5)

## 2016-08-03 LAB — TYPE AND SCREEN
ABO/RH(D): B NEG
ANTIBODY SCREEN: NEGATIVE

## 2016-08-03 SURGERY — DILATION AND EVACUATION, UTERUS, SECOND TRIMESTER
Anesthesia: General | Site: Vagina

## 2016-08-03 MED ORDER — LACTATED RINGERS IV SOLN
INTRAVENOUS | Status: DC | PRN
  Administered 2016-08-03: 22:00:00 via INTRAVENOUS

## 2016-08-03 MED ORDER — SUCCINYLCHOLINE CHLORIDE 20 MG/ML IJ SOLN
INTRAMUSCULAR | Status: DC | PRN
  Administered 2016-08-03: 140 mg via INTRAVENOUS

## 2016-08-03 MED ORDER — ROCURONIUM BROMIDE 100 MG/10ML IV SOLN
INTRAVENOUS | Status: AC
  Filled 2016-08-03: qty 1

## 2016-08-03 MED ORDER — PROMETHAZINE HCL 25 MG/ML IJ SOLN: 6.2500 mg | INTRAMUSCULAR | Status: DC | PRN

## 2016-08-03 MED ORDER — METHYLERGONOVINE MALEATE 0.2 MG/ML IJ SOLN
INTRAMUSCULAR | Status: AC
  Filled 2016-08-03: qty 1

## 2016-08-03 MED ORDER — OXYCODONE HCL 5 MG PO TABS
5.0000 mg | ORAL_TABLET | Freq: Once | ORAL | Status: AC | PRN
  Administered 2016-08-03: 5 mg via ORAL

## 2016-08-03 MED ORDER — FENTANYL CITRATE (PF) 100 MCG/2ML IJ SOLN
INTRAMUSCULAR | Status: AC
  Filled 2016-08-03: qty 2

## 2016-08-03 MED ORDER — LACTATED RINGERS IV SOLN: INTRAVENOUS | Status: DC

## 2016-08-03 MED ORDER — MIDAZOLAM HCL 2 MG/2ML IJ SOLN
INTRAMUSCULAR | Status: AC
  Filled 2016-08-03: qty 2

## 2016-08-03 MED ORDER — DEXAMETHASONE SODIUM PHOSPHATE 4 MG/ML IJ SOLN
INTRAMUSCULAR | Status: AC
  Filled 2016-08-03: qty 1

## 2016-08-03 MED ORDER — BUPIVACAINE HCL 0.5 % IJ SOLN
INTRAMUSCULAR | Status: DC | PRN
  Administered 2016-08-03: 15 mL

## 2016-08-03 MED ORDER — ONDANSETRON HCL 4 MG/2ML IJ SOLN
INTRAMUSCULAR | Status: DC | PRN
  Administered 2016-08-03: 4 mg via INTRAVENOUS

## 2016-08-03 MED ORDER — PROPOFOL 10 MG/ML IV BOLUS
INTRAVENOUS | Status: DC | PRN
  Administered 2016-08-03: 200 mg via INTRAVENOUS

## 2016-08-03 MED ORDER — DOXYCYCLINE HYCLATE 100 MG IV SOLR
200.0000 mg | INTRAVENOUS | Status: AC
  Administered 2016-08-03: 200 mg via INTRAVENOUS
  Filled 2016-08-03: qty 200

## 2016-08-03 MED ORDER — FENTANYL CITRATE (PF) 100 MCG/2ML IJ SOLN
25.0000 ug | INTRAMUSCULAR | Status: DC | PRN
Start: 2016-08-03 — End: 2016-08-04

## 2016-08-03 MED ORDER — PROPOFOL 10 MG/ML IV BOLUS
INTRAVENOUS | Status: AC
  Filled 2016-08-03: qty 20

## 2016-08-03 MED ORDER — KETOROLAC TROMETHAMINE 30 MG/ML IJ SOLN
INTRAMUSCULAR | Status: AC
  Filled 2016-08-03: qty 1

## 2016-08-03 MED ORDER — OXYCODONE HCL 5 MG/5ML PO SOLN: 5.0000 mg | Freq: Once | ORAL | Status: AC | PRN

## 2016-08-03 MED ORDER — DEXAMETHASONE SODIUM PHOSPHATE 4 MG/ML IJ SOLN
INTRAMUSCULAR | Status: DC | PRN
  Administered 2016-08-03: 4 mg via INTRAVENOUS

## 2016-08-03 MED ORDER — FENTANYL CITRATE (PF) 100 MCG/2ML IJ SOLN
INTRAMUSCULAR | Status: DC | PRN
  Administered 2016-08-03 (×2): 100 ug via INTRAVENOUS

## 2016-08-03 MED ORDER — DOCUSATE SODIUM 100 MG PO CAPS: 100.0000 mg | ORAL_CAPSULE | Freq: Two times a day (BID) | ORAL | 2 refills | Status: DC | PRN

## 2016-08-03 MED ORDER — SODIUM CHLORIDE 0.9 % IV SOLN
Freq: Once | INTRAVENOUS | Status: AC
  Administered 2016-08-03: 19:00:00 via INTRAVENOUS

## 2016-08-03 MED ORDER — ONDANSETRON HCL 4 MG/2ML IJ SOLN
INTRAMUSCULAR | Status: AC
  Filled 2016-08-03: qty 2

## 2016-08-03 MED ORDER — MEPERIDINE HCL 25 MG/ML IJ SOLN: 6.2500 mg | INTRAMUSCULAR | Status: DC | PRN

## 2016-08-03 MED ORDER — FAMOTIDINE IN NACL 20-0.9 MG/50ML-% IV SOLN
20.0000 mg | Freq: Once | INTRAVENOUS | Status: AC
  Administered 2016-08-03: 20 mg via INTRAVENOUS
  Filled 2016-08-03: qty 50

## 2016-08-03 MED ORDER — LIDOCAINE HCL (CARDIAC) 20 MG/ML IV SOLN
INTRAVENOUS | Status: AC
  Filled 2016-08-03: qty 5

## 2016-08-03 MED ORDER — SOD CITRATE-CITRIC ACID 500-334 MG/5ML PO SOLN
30.0000 mL | Freq: Once | ORAL | Status: AC
  Administered 2016-08-03: 30 mL via ORAL
  Filled 2016-08-03: qty 15

## 2016-08-03 MED ORDER — SUCCINYLCHOLINE CHLORIDE 200 MG/10ML IV SOSY
PREFILLED_SYRINGE | INTRAVENOUS | Status: AC
  Filled 2016-08-03: qty 10

## 2016-08-03 MED ORDER — METHYLERGONOVINE MALEATE 0.2 MG/ML IJ SOLN
INTRAMUSCULAR | Status: DC | PRN
  Administered 2016-08-03: 0.2 mg via INTRAMUSCULAR

## 2016-08-03 MED ORDER — MIDAZOLAM HCL 2 MG/2ML IJ SOLN
INTRAMUSCULAR | Status: DC | PRN
Start: 2016-08-03 — End: 2016-08-03
  Administered 2016-08-03: 2 mg via INTRAVENOUS

## 2016-08-03 MED ORDER — KETOROLAC TROMETHAMINE 30 MG/ML IJ SOLN
INTRAMUSCULAR | Status: DC | PRN
  Administered 2016-08-03: 30 mg via INTRAVENOUS

## 2016-08-03 MED ORDER — OXYCODONE-ACETAMINOPHEN 5-325 MG PO TABS: 1.0000 | ORAL_TABLET | Freq: Four times a day (QID) | ORAL | 0 refills | Status: DC | PRN

## 2016-08-03 MED ORDER — IBUPROFEN 600 MG PO TABS: 600.0000 mg | ORAL_TABLET | Freq: Four times a day (QID) | ORAL | 3 refills | Status: DC | PRN

## 2016-08-03 SURGICAL SUPPLY — 21 items
ADAPTER VACURETTE TBG SET 14 (CANNULA) ×1 IMPLANT
CATH ROBINSON RED A/P 16FR (CATHETERS) ×2 IMPLANT
CLOTH BEACON ORANGE TIMEOUT ST (SAFETY) ×2 IMPLANT
CONT PATH 16OZ SNAP LID 3702 (MISCELLANEOUS) IMPLANT
DECANTER SPIKE VIAL GLASS SM (MISCELLANEOUS) ×2 IMPLANT
GLOVE BIOGEL PI IND STRL 7.0 (GLOVE) ×3 IMPLANT
GLOVE BIOGEL PI INDICATOR 7.0 (GLOVE) ×3
GLOVE ECLIPSE 7.0 STRL STRAW (GLOVE) ×2 IMPLANT
GOWN STRL REUS W/TWL LRG LVL3 (GOWN DISPOSABLE) ×4 IMPLANT
KIT BERKELEY 1ST TRIMESTER 3/8 (MISCELLANEOUS) ×2 IMPLANT
KIT BERKELEY 2ND TRIMESTER 1/2 (COLLECTOR) ×2 IMPLANT
NS IRRIG 1000ML POUR BTL (IV SOLUTION) ×2 IMPLANT
PACK VAGINAL MINOR WOMEN LF (CUSTOM PROCEDURE TRAY) ×2 IMPLANT
PAD OB MATERNITY 4.3X12.25 (PERSONAL CARE ITEMS) ×2 IMPLANT
PAD PREP 24X48 CUFFED NSTRL (MISCELLANEOUS) ×2 IMPLANT
SET BERKELEY SUCTION TUBING (SUCTIONS) ×1 IMPLANT
TOWEL OR 17X24 6PK STRL BLUE (TOWEL DISPOSABLE) ×4 IMPLANT
TUBE VACURETTE 2ND TRIMESTER (CANNULA) ×2 IMPLANT
VACURETTE 12 RIGID CVD (CANNULA) IMPLANT
VACURETTE 14MM CVD 1/2 BASE (CANNULA) ×1 IMPLANT
VACURETTE 16MM ASPIR CVD .5 (CANNULA) IMPLANT

## 2016-08-03 NOTE — Discharge Instructions (Signed)
Please do NOT take Motrin at home until after 4:30 am.    Dilation and Curettage or Vacuum Curettage, Care After This sheet gives you information about how to care for yourself after your procedure. Your health care provider may also give you more specific instructions. If you have problems or questions, contact your health care provider. What can I expect after the procedure? After your procedure, it is common to have:  Mild pain or cramping.  Some vaginal bleeding or spotting. These may last for up to 2 weeks after your procedure. Follow these instructions at home: Activity  Do not drive or use heavy machinery while taking prescription pain medicine.  Avoid driving for the first 24 hours after your procedure.  Take frequent, short walks, followed by rest periods, throughout the day. Ask your health care provider what activities are safe for you. After 1-2 days, you may be able to return to your normal activities.  Do not lift anything heavier than 10 lb (4.5 kg) until your health care provider approves.  For at least 2 weeks, or as long as told by your health care provider, do not:  Douche.  Use tampons.  Have sexual intercourse. General instructions  Take over-the-counter and prescription medicines only as told by your health care provider. This is especially important if you take blood thinning medicine.  Do not take baths, swim, or use a hot tub until your health care provider approves. Take showers instead of baths.  Wear compression stockings as told by your health care provider. These stockings help to prevent blood clots and reduce swelling in your legs.  It is your responsibility to get the results of your procedure. Ask your health care provider, or the department performing the procedure, when your results will be ready.  Keep all follow-up visits as told by your health care provider. This is important. Contact a health care provider if:  You have severe cramps  that get worse or that do not get better with medicine.  You have severe abdominal pain.  You cannot drink fluids without vomiting.  You develop pain in a different area of your pelvis.  You have bad-smelling vaginal discharge.  You have a rash. Get help right away if:  You have vaginal bleeding that soaks more than one sanitary pad in 1 hour, for 2 hours in a row.  You pass large blood clots from your vagina.  You have a fever that is above 100.36F (38.0C).  Your abdomen feels very tender or hard.  You have chest pain.  You have shortness of breath.  You cough up blood.  You feel dizzy or light-headed.  You faint.  You have pain in your neck or shoulder area. This information is not intended to replace advice given to you by your health care provider. Make sure you discuss any questions you have with your health care provider. Document Released: 06/22/2000 Document Revised: 02/22/2016 Document Reviewed: 01/26/2016 Elsevier Interactive Patient Education  2017 [employer]

## 2016-08-03 NOTE — Transfer of Care (Signed)
Immediate Anesthesia Transfer of Care Note  Patient: Molly SpiesDiamond K Jackowski  Procedure(s) Performed: Procedure(s): DILATATION AND EVACUATION (D&E) 2ND TRIMESTER (N/A)  Patient Location: PACU  Anesthesia Type:General  Level of Consciousness: awake, alert  and oriented  Airway & Oxygen Therapy: Patient Spontanous Breathing and Patient connected to nasal cannula oxygen  Post-op Assessment: Report given to RN and Post -op Vital signs reviewed and stable  Post vital signs: Reviewed and stable  Last Vitals:  Vitals:   08/03/16 1824  BP: 121/65  Pulse: 102  Resp: 18  Temp: 37 C    Last Pain:  Vitals:   08/03/16 1824  TempSrc: Oral         Complications: No apparent anesthesia complications

## 2016-08-03 NOTE — MAU Note (Signed)
[facility]. The abortion was unsuccessful, fetal head remains in uterus per pt, [facility]

## 2016-08-03 NOTE — H&P (Signed)
Faculty Practice OB/GYN History and Physical  Molly Bowers is a 26 y.o. [id number] who was sent here after incomplete abortion done at [facility] today.  Patient was undergoing TAB at [redacted]w[redacted]d under paracervical block, complicated by retained fetal calvarium. Attempts were made to remove this under ultrasound guidance, but were unsuccessful due to patient's discomfort.  She desired more anesthesia, and wanted to come here for completion. No heavy bleeding, reports moderate cramping.  No fevers.   No other significant preoperative concerns.  Proposed surgery: Dilation and Evacuation under ultrasound guidance in the OR  Past Medical History:  Diagnosis Date  . Anxiety   . Headache   . Medical history non-contributory   . Ovarian cyst    Past Surgical History:  Procedure Laterality Date  . CESAREAN SECTION N/A 10/28/2015   Procedure: CESAREAN SECTION;  Surgeon: Kathreen Cosier, MD;  Location: [facility];  Service: Obstetrics;  Laterality: N/A;  . KELOID EXCISION     OB History  Gravida Para Term Preterm AB Living  3 2 2  0 0 2  SAB TAB Ectopic Multiple Live Births  0 0 0 0 2    # Outcome Date GA Lbr Len/2nd Weight Sex Delivery Anes PTL Lv  3 Gravida           2 Term 10/28/15 [redacted]w[redacted]d  7 lb 4.9 oz (3.315 kg) F CS-LTranv Spinal N LIV  1 Term 11/12/10 [redacted]w[redacted]d  8 lb 6 oz (3.799 kg) M Vag-Spont EPI N LIV    Patient denies any other pertinent gynecologic issues.   No current facility-administered medications on file prior to encounter.    No current outpatient prescriptions on file prior to encounter.   Allergies  Allergen Reactions  . Other Anaphylaxis    PEANUTS  . Peanut Oil Anaphylaxis  . Peanut-Containing Drug Products Anaphylaxis  . Penicillins Hives, Rash and Other (See Comments)    No reaction noted Has patient had a PCN reaction causing immediate rash, facial/tongue/throat swelling, SOB or lightheadedness with hypotension:NO Has patient had a PCN reaction causing severe  rash involving mucus membranes or skin necrosis:NO Has patient had a PCN reaction that required hospitalization NO Has patient had a PCN reaction occurring within the last 10 years: NO If all of the above answers are "NO", then may proceed with Cephalosporin use.   Marland Kitchen Phenylephrine Hives  . Vancomycin Itching    Social History:   reports that she quit smoking about 19 months ago. Her smoking use included Cigarettes. She has never used smokeless tobacco. She reports that she does not drink alcohol or use drugs.  Family History  Problem Relation Age of Onset  . Diabetes Mother   . Hypertension Mother   . Cancer Maternal Grandmother     brain tumor, died during surgery  . Cancer Paternal Grandfather     prostate    Review of Systems: Noncontributory  PHYSICAL EXAM: Blood pressure 121/65, pulse 102, temperature 98.6 F (37 C), temperature source Oral, resp. rate 18, last menstrual period 04/29/2016, unknown if currently breastfeeding. CONSTITUTIONAL: Well-developed, well-nourished female in no acute distress.  HENT:  Normocephalic, atraumatic, External right and left ear normal. Oropharynx is clear and moist EYES: Conjunctivae and EOM are normal. Pupils are equal, round, and reactive to light. No scleral icterus.  NECK: Normal range of motion, supple, no masses SKIN: Skin is warm and dry. No rash noted. Not diaphoretic. No erythema. No pallor. NEUROLOGIC: Alert and oriented to person, place, and time. Normal reflexes,  muscle tone coordination. No cranial nerve deficit noted. PSYCHIATRIC: Normal mood and affect. Normal behavior. Normal judgment and thought content. CARDIOVASCULAR: Normal heart rate noted, regular rhythm RESPIRATORY: Effort and breath sounds normal, no problems with respiration noted ABDOMEN: Soft, nontender, nondistended. PELVIC: Deferred MUSCULOSKELETAL: Normal range of motion. No edema and no tenderness. 2+ distal pulses.  Labs: Results for orders placed or  performed during the hospital encounter of 08/03/16 (from the past 336 hour(s))  CBC   Collection Time: 08/03/16  6:39 PM  Result Value Ref Range   WBC 17.7 (H) 4.0 - 10.5 K/uL   RBC 4.23 3.87 - 5.11 MIL/uL   Hemoglobin 12.3 12.0 - 15.0 g/dL   HCT 40.936.0 81.136.0 - 91.446.0 %   MCV 85.1 78.0 - 100.0 fL   MCH 29.1 26.0 - 34.0 pg   MCHC 34.2 30.0 - 36.0 g/dL   RDW 78.214.4 95.611.5 - 21.315.5 %   Platelets 194 150 - 400 K/uL    Imaging Studies: 08/03/16  Bedside Ultrasound: Calvarium noted in uterine cavity.  Assessment: Patient Active Problem List   Diagnosis Date Noted  . Retained products of conception following abortion 08/03/2016    Plan: Patient will undergo surgical management with D&E.  Risks of surgery including bleeding, infection, injury to surrounding organs, need for additional procedures, possibility of intrauterine scarring which may impair future fertility, risk of retained products which may require further management and other postoperative/anesthesia complications were explained to patient.  Likelihood of success of complete evacuation of the uterus was discussed with the patient.  Written informed consent was obtained.  Patient has been NPO since 1400  she will remain NPO for procedure. Anesthesia and OR aware.  Preoperative prophylactic Doxycycline 200mg  IV  has been ordered and is on call to the OR.  To OR when ready.   The plan will be to send patient home after surgery if there are no further complications.   Jaynie CollinsUGONNA  Amaurie Schreckengost, M.D. 08/03/2016 7:13 PM

## 2016-08-03 NOTE — Op Note (Signed)
Louie Bostoniamond K Kenner PROCEDURE DATE:  08/03/2016  PREOPERATIVE DIAGNOSIS: Retained products of conception following abortion POSTOPERATIVE DIAGNOSIS: The same PROCEDURE:  Dilation and Evacuation under ultrasound guidance SURGEON:  Dr. Jaynie CollinsUgonna Germany Chelf  INDICATIONS: 26 y.o.  [id number] with aforementioned diagnosis needing surgical completion.  Risks of surgery were discussed with the patient and her friend including but not limited to: bleeding which may require transfusion; infection which may require antibiotics; injury to uterus or surrounding organs; need for additional procedures including laparotomy or laparoscopy; possibility of intrauterine scarring which may impair future fertility; and other postoperative/anesthesia complications. Written informed consent was obtained.  FINDINGS:  Retained fetal calvarium noted with small amount of other products of conception.  Empty endometrial stripe noted on ultrasound at the end of the procedure.   ANESTHESIA:    General, paracervical block with 15 ml of 0.5% Marcaine INTRAVENOUS FLUIDS:  100 ml of LR ESTIMATED BLOOD LOSS:  50 ml SPECIMENS:  Products of conception sent to pathology COMPLICATIONS:  None immediate.  PROCEDURE DETAILS:  The patient received intravenous Doxycycline while in the preoperative area.  She was then taken to the operating room where general anesthesia was administered and was found to be adequate.  After an adequate timeout was performed, she was placed in the dorsal lithotomy position and examined; then prepped and draped in the sterile manner.   Her bladder was catheterized for an unmeasured amount of clear, yellow urine. A vaginal speculum was then placed in the patient's vagina and a single tooth tenaculum was applied to the anterior lip of the cervix.  A paracervical block using 15 ml of 0.5% Marcaine was administered. The cervix was noted to be dilated and was able to accommodate a 14 mm suction curette that was gently advanced to  the uterine fundus.  The suction device was then activated and curette slowly rotated to clear the uterus of products of conception.  A sharp curettage was then performed to confirm complete emptying of the uterus. There was an empty endometrial stripe noted on the ultrasound at the end of the curettage. Methergine 0.2 mg IM was administered to help prevent bleeding. There was minimal bleeding noted at the end of the procedure, and the tenaculum removed with good hemostasis noted.   All instruments were removed from the patient's vagina.  Sponge and instrument counts were correct times three.    The patient tolerated the procedure well and was taken to the recovery area awake, extubated and in stable condition.  The patient will be discharged to home as per PACU criteria.  Routine postoperative instructions given.  She was prescribed Percocet, Ibuprofen and Colace.  She will follow up in the clinic in 2-3 weeks for postoperative evaluation.    Jaynie CollinsUGONNA  Davaris Youtsey, MD, FACOG Attending Obstetrician & Gynecologist Faculty Practice, [facility] [place]

## 2016-08-03 NOTE — Anesthesia Postprocedure Evaluation (Addendum)
Anesthesia Post Note  Patient: Laurena SpiesDiamond K Cosby  Procedure(s) Performed: Procedure(s) (LRB): DILATATION AND EVACUATION (D&E) 2ND TRIMESTER (N/A)  Patient location during evaluation: PACU Anesthesia Type: General Level of consciousness: awake and alert Pain management: pain level controlled Vital Signs Assessment: post-procedure vital signs reviewed and stable Respiratory status: spontaneous breathing, nonlabored ventilation, respiratory function stable and patient connected to nasal cannula oxygen Cardiovascular status: blood pressure returned to baseline and stable Postop Assessment: no signs of nausea or vomiting Anesthetic complications: no        Last Vitals:  Vitals:   08/03/16 2251 08/03/16 2315  BP: 130/69 124/70  Pulse: (!) 104 98  Resp: 15 (!) 23  Temp: 36.7 C     Last Pain:  Vitals:   08/03/16 1824  TempSrc: Oral   Pain Goal:                 Shelton SilvasKevin D Jahmarion Popoff

## 2016-08-03 NOTE — Anesthesia Preprocedure Evaluation (Signed)
Anesthesia Evaluation  Patient identified by MRN, date of birth, ID band Patient awake    Reviewed: Allergy & Precautions, NPO status , Patient's Chart, lab work & pertinent test results  Airway Mallampati: I  TM Distance: >3 FB Neck ROM: Full    Dental  (+) Teeth Intact, Dental Advisory Given   Pulmonary former smoker,    breath sounds clear to auscultation       Cardiovascular negative cardio ROS   Rhythm:Regular Rate:Normal     Neuro/Psych  Headaches, PSYCHIATRIC DISORDERS Anxiety    GI/Hepatic negative GI ROS, Neg liver ROS,   Endo/Other  negative endocrine ROS  Renal/GU negative Renal ROS  negative genitourinary   Musculoskeletal negative musculoskeletal ROS (+)   Abdominal   Peds negative pediatric ROS (+)  Hematology negative hematology ROS (+)   Anesthesia Other Findings   Reproductive/Obstetrics negative OB ROS                             Anesthesia Physical Anesthesia Plan  ASA: II  Anesthesia Plan: General   Post-op Pain Management:    Induction: Rapid sequence, Cricoid pressure planned and Intravenous  Airway Management Planned: Oral ETT  Additional Equipment:   Intra-op Plan:   Post-operative Plan: Extubation in OR  Informed Consent: I have reviewed the patients History and Physical, chart, labs and discussed the procedure including the risks, benefits and alternatives for the proposed anesthesia with the patient or authorized representative who has indicated his/her understanding and acceptance.   Dental advisory given  Plan Discussed with: CRNA  Anesthesia Plan Comments:         Anesthesia Quick Evaluation

## 2016-08-03 NOTE — Anesthesia Procedure Notes (Signed)
Procedure Name: Intubation Date/Time: 08/03/2016 10:06 PM Performed by: Rogan Ecklund, Sheron Nightingale Pre-anesthesia Checklist: Patient identified, Emergency Drugs available, Suction available, Patient being monitored and Timeout performed Patient Re-evaluated:Patient Re-evaluated prior to inductionOxygen Delivery Method: Circle system utilized Preoxygenation: Pre-oxygenation with 100% oxygen Intubation Type: IV induction Laryngoscope Size: Mac and 3 Grade View: Grade I Tube type: Oral Tube size: 7.0 mm Number of attempts: 1 Placement Confirmation: ETT inserted through vocal cords under direct vision Secured at: 22 cm Dental Injury: Teeth and Oropharynx as per pre-operative assessment

## 2016-08-06 ENCOUNTER — Telehealth: Payer: Self-pay | Admitting: *Deleted

## 2016-08-06 ENCOUNTER — Encounter (HOSPITAL_COMMUNITY): Payer: Self-pay | Admitting: Obstetrics & Gynecology

## 2016-08-06 NOTE — Telephone Encounter (Signed)
LM on voicemail to call office to schedule a post appt in about 2 weeks.

## 2016-08-06 NOTE — Telephone Encounter (Signed)
-----   Message from Tereso NewcomerUgonna A Anyanwu, MD sent at 08/03/2016 10:44 PM EST ----- Regarding: Please schedule for postop with any MD Had D&E for incomplete abortion/retained POCs after abortion on 08/03/16.  Needs 2 week postop visit.  Thank you.  Vela ProseUgonna

## 2016-08-27 ENCOUNTER — Ambulatory Visit (INDEPENDENT_AMBULATORY_CARE_PROVIDER_SITE_OTHER): Payer: Medicaid Other | Admitting: Obstetrics & Gynecology

## 2016-08-27 ENCOUNTER — Encounter: Payer: Self-pay | Admitting: Obstetrics & Gynecology

## 2016-08-27 VITALS — BP 108/57 | HR 79 | Ht 66.0 in | Wt 236.0 lb

## 2016-08-27 DIAGNOSIS — Z3041 Encounter for surveillance of contraceptive pills: Secondary | ICD-10-CM

## 2016-08-27 DIAGNOSIS — Z8759 Personal history of other complications of pregnancy, childbirth and the puerperium: Secondary | ICD-10-CM | POA: Insufficient documentation

## 2016-08-27 DIAGNOSIS — Z30016 Encounter for initial prescription of transdermal patch hormonal contraceptive device: Secondary | ICD-10-CM

## 2016-08-27 MED ORDER — NORELGESTROMIN-ETH ESTRADIOL 150-35 MCG/24HR TD PTWK
1.0000 | MEDICATED_PATCH | TRANSDERMAL | 12 refills | Status: DC
Start: 2016-08-27 — End: 2017-10-08

## 2016-08-27 NOTE — Progress Notes (Signed)
   Subjective:    Patient ID: Molly Bowers, female    DOB: 06/05/1991, 26 y.o.   MRN: [id number]  HPI  Pt presents for birth control.  Pt s/p D & E for missed abortion with Dr. Macon LargeAnyanwu.  Pt wants paraguard.  Had had in the past.  Pt would like to come back for paraguard.  Pt would like Rx for Ortho Evra while waiting to come back for Paraguard.  Pt understands that she could be pregnant.  Pt should use condoms during first 2 weeks of starting ortho evra.  Pt understands risk of stoke and clot with Ortho Evra (increased amounts of estrogen).  Review of Systems  Constitutional: Negative.   Respiratory: Negative.   Cardiovascular: Negative.   Gastrointestinal: Negative.   Genitourinary: Negative.   Psychiatric/Behavioral: Negative.        Objective:   Physical Exam  Constitutional: She is oriented to person, place, and time. She appears well-developed and well-nourished. No distress.  HENT:  Head: Normocephalic and atraumatic.  Eyes: Conjunctivae are normal.  Pulmonary/Chest: Effort normal.  Abdominal: Soft. She exhibits no distension.  Musculoskeletal: She exhibits no edema.  Neurological: She is alert and oriented to person, place, and time.  Skin: Skin is warm and dry.  Psychiatric: She has a normal mood and affect.  Vitals reviewed.  Vitals:   08/27/16 1427  BP: (!) 108/57  Pulse: 79  Weight: 236 lb (107 kg)  Height: 5\' 6"  (1.676 m)       Assessment & Plan:  26 yo female desiring contraception.  1--Ortho Evra (see counseling above) 2--Paraguard with next menses.

## 2016-09-17 ENCOUNTER — Ambulatory Visit: Payer: Medicaid Other | Admitting: Obstetrics & Gynecology

## 2016-11-23 IMAGING — US US ART/VEN ABD/PELV/SCROTUM DOPPLER LTD
1 series · 13 of 25 positions shown · non-contrast
Comparison: None.

CLINICAL DATA: Right lower quadrant pain for 5 days evaluate for
ovarian torsion.

EXAM:
TRANSABDOMINAL AND TRANSVAGINAL ULTRASOUND OF PELVIS
DOPPLER ULTRASOUND OF OVARIES
TECHNIQUE: Both transabdominal and transvaginal ultrasound examinations of the
pelvis were performed. Transabdominal technique was performed for
global imaging of the pelvis including uterus, ovaries, adnexal
regions, and pelvic cul-de-sac.
It was necessary to proceed with endovaginal exam following the
transabdominal exam to visualize the uterus and ovaries.. Color and
duplex Doppler ultrasound was utilized to evaluate blood flow to the
ovaries.

[Series 1: us art/ven abd/pelv/scrotum doppler ltd · 0.21mm/px · 80 acquisitions, 13 frames shown]
[im 1/80]
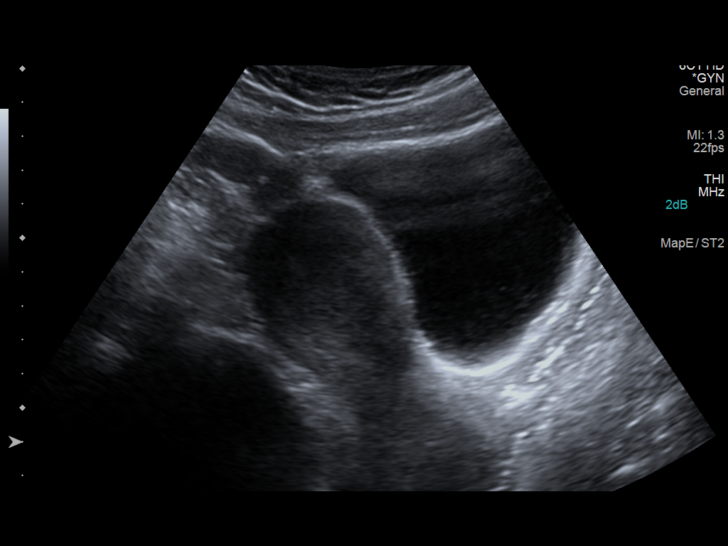
[im 7/80]
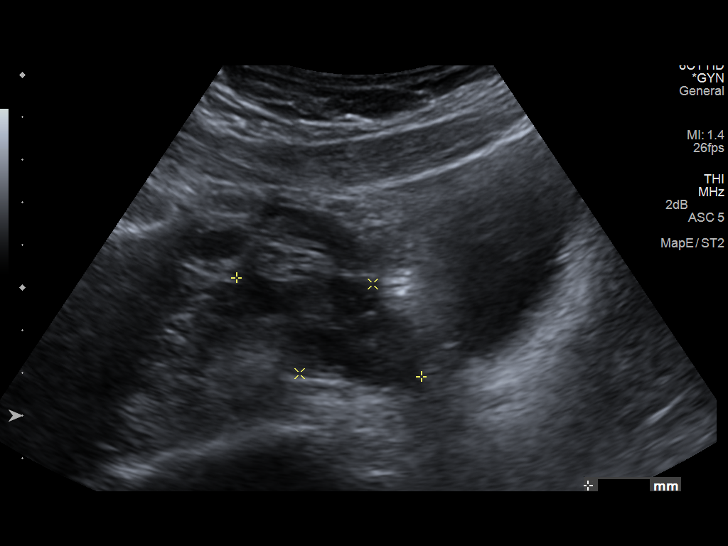
[im 14/80]
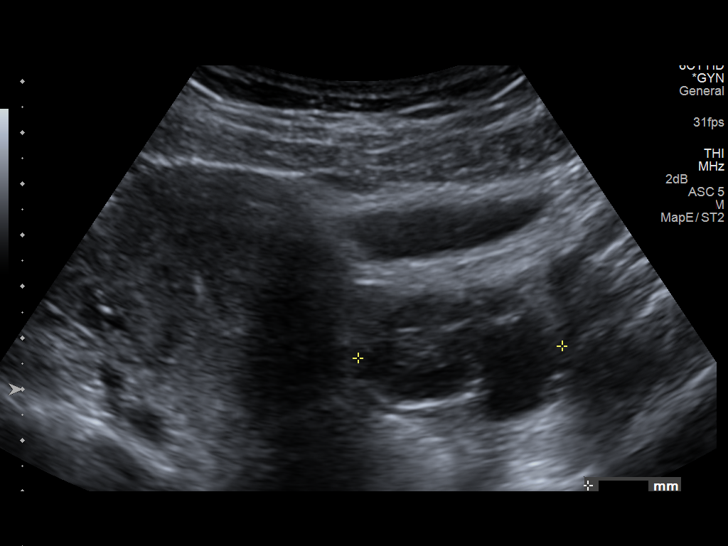
[im 20/80]
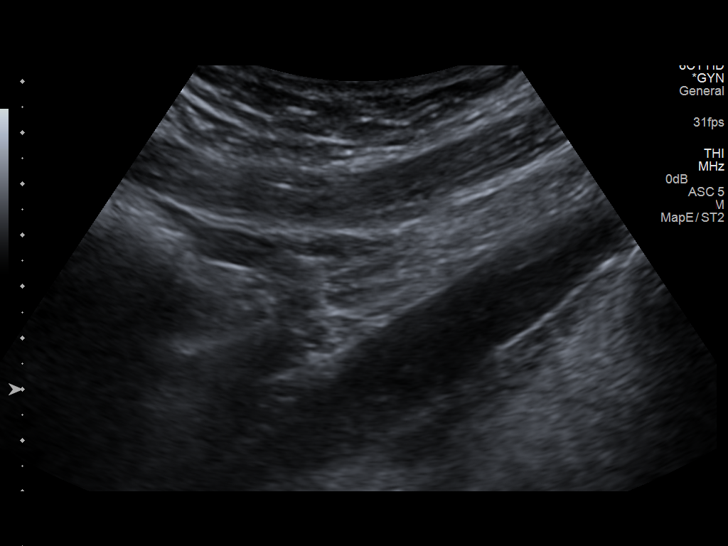
[im 27/80]
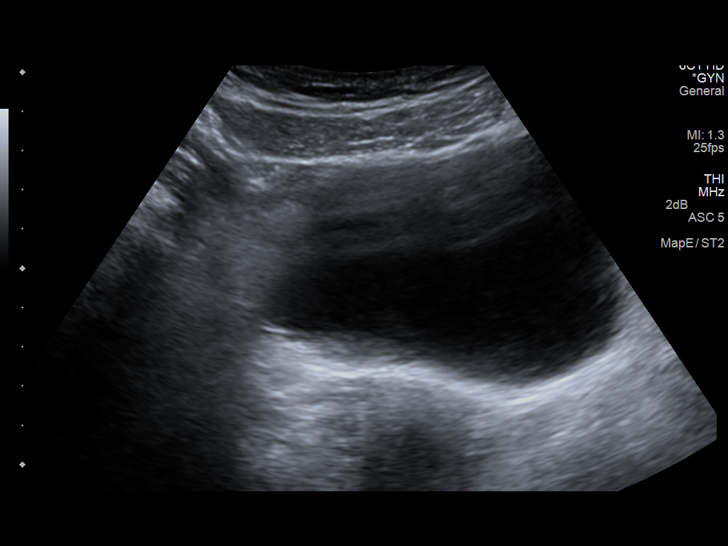
[im 33/80]
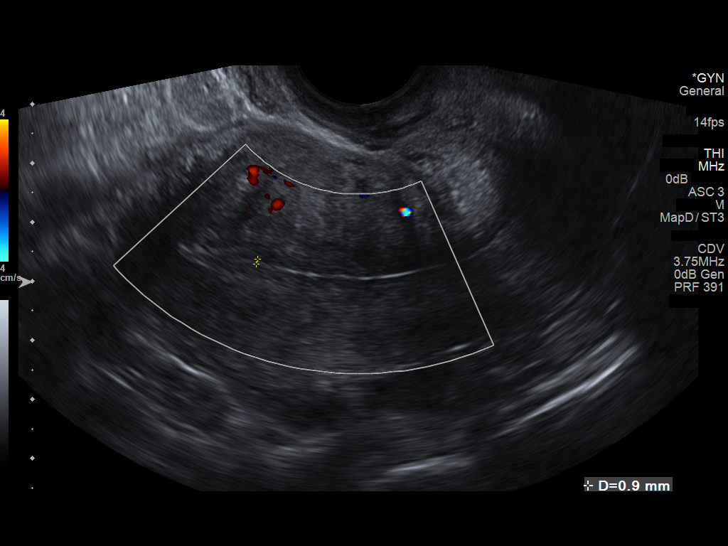
[im 40/80]
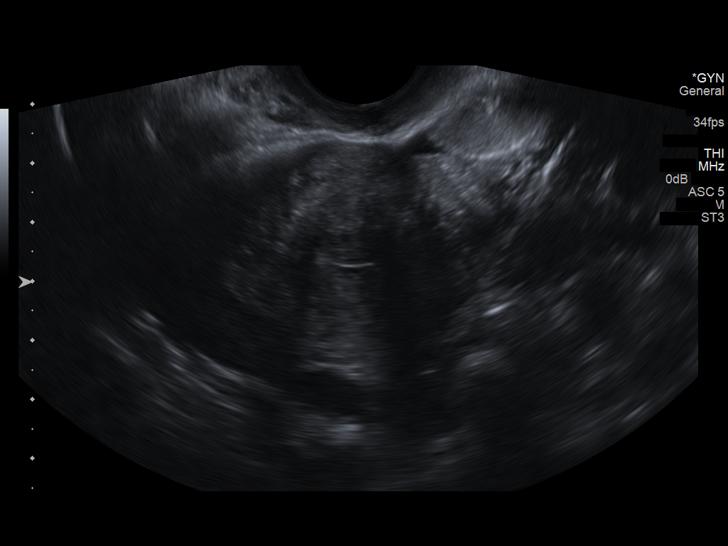
[im 47/80]
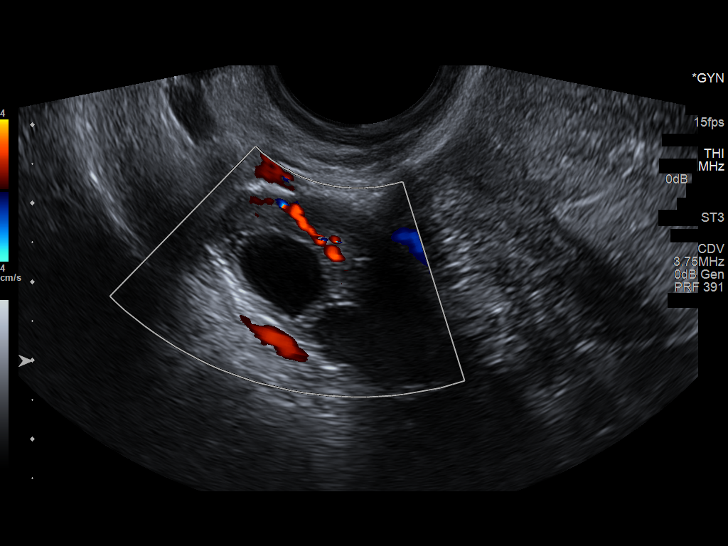
[im 53/80]
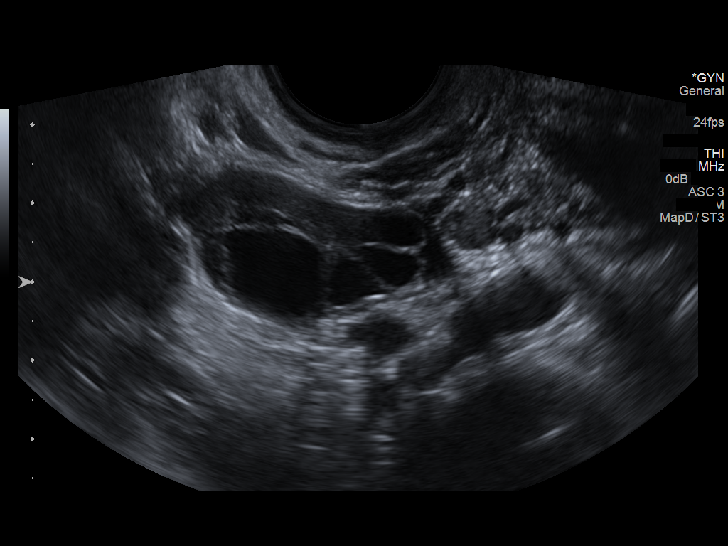
[im 60/80]
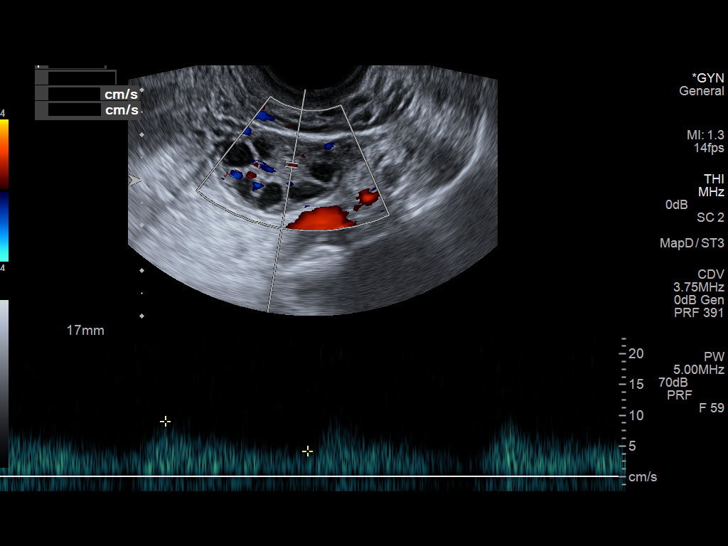
[im 66/80]
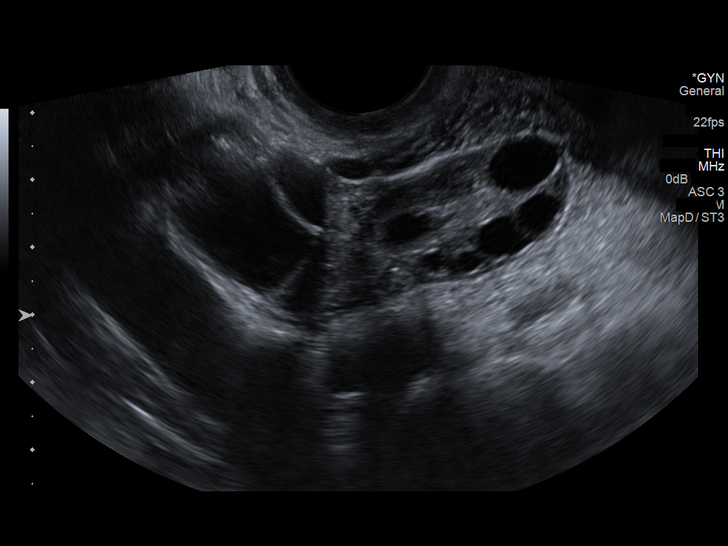
[im 73/80]
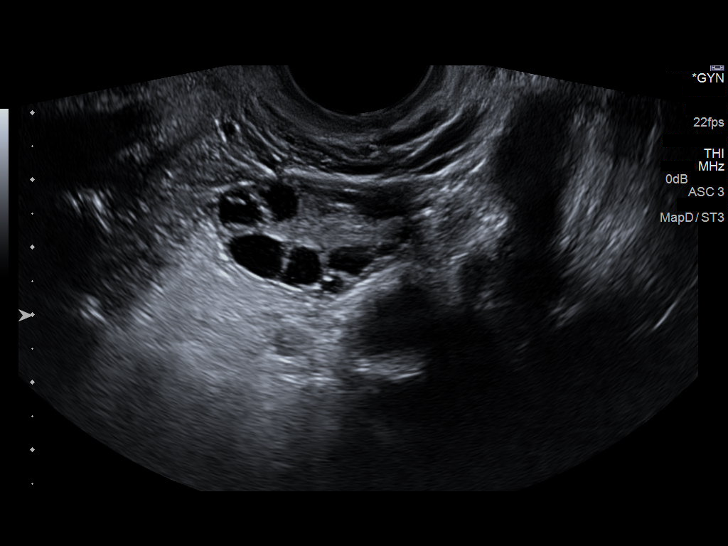
[im 80/80]
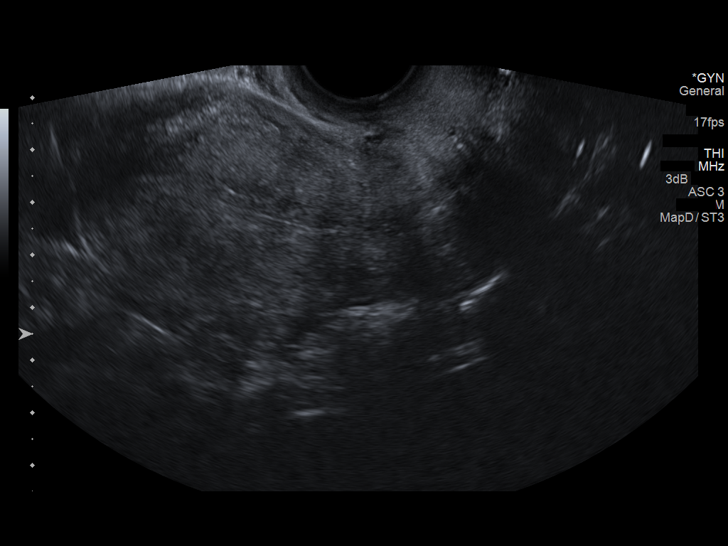

[13 of 25 positions shown; findings below may reference images not displayed]

FINDINGS: Uterus

Measurements: 10.4 x 4.6 x 5 cm.. No fibroids or other mass
visualized.

Endometrium

Thickness: 1 mm.  No focal abnormality visualized.

Right ovary

Measurements: 4.1 x 2.1 x 2.2 cm. Normal appearance/no adnexal mass.

Left ovary

Measurements: 6.1 x 2.6 x 5.6 cm. Normal appearance. There is an
exophytic simple cyst measuring 2.6 x 1.7 x 2.5 cm.

Pulsed Doppler evaluation of both ovaries demonstrates normal
low-resistance arterial and venous waveforms.

Other findings

No free fluid.
IMPRESSION: No evidence of ovarian torsion.

## 2016-12-07 NOTE — Addendum Note (Signed)
Addendum  created 12/07/16 0938 by Everest Brod D, MD   Sign clinical note    

## 2016-12-12 ENCOUNTER — Encounter: Payer: Self-pay | Admitting: Obstetrics & Gynecology

## 2016-12-12 ENCOUNTER — Ambulatory Visit: Payer: Medicaid Other | Admitting: Obstetrics & Gynecology

## 2016-12-12 NOTE — Progress Notes (Signed)
Pt did not show for ptt.  Tanya Buckson to call patient to reschedule.

## 2017-01-11 IMAGING — US US OB COMP LESS 14 WK
1 series · 14 of 28 positions shown · non-contrast
Comparison: 03/06/2015

CLINICAL DATA: Postcoital pelvic pain for 1 day. Pregnant patient
with estimated date of delivery 10/27/2015

EXAM:
OBSTETRIC <14 WK US AND TRANSVAGINAL OB US
TECHNIQUE: Both transabdominal and transvaginal ultrasound examinations were
performed for complete evaluation of the gestation as well as the
maternal uterus, adnexal regions, and pelvic cul-de-sac.
Transvaginal technique was performed to assess early pregnancy.

[Series 1: us ob comp less 14 wk · 0.22mm/px · 14 of 74 slices shown]
[im 3/74]
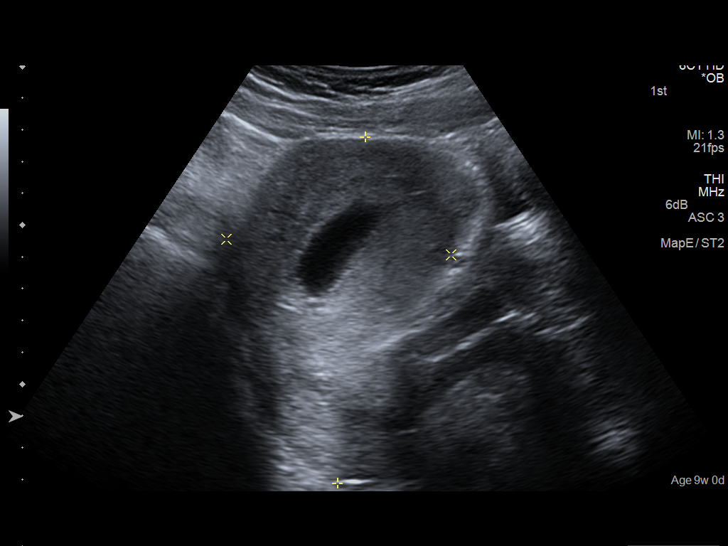
[im 9/74]
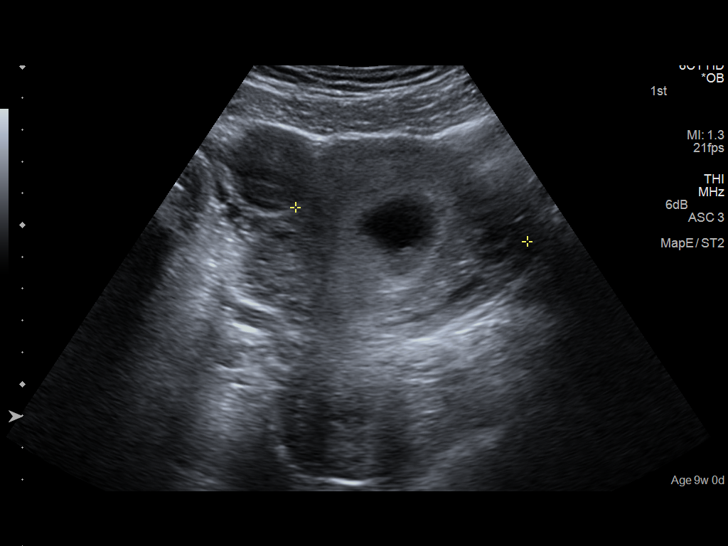
[im 14/74]
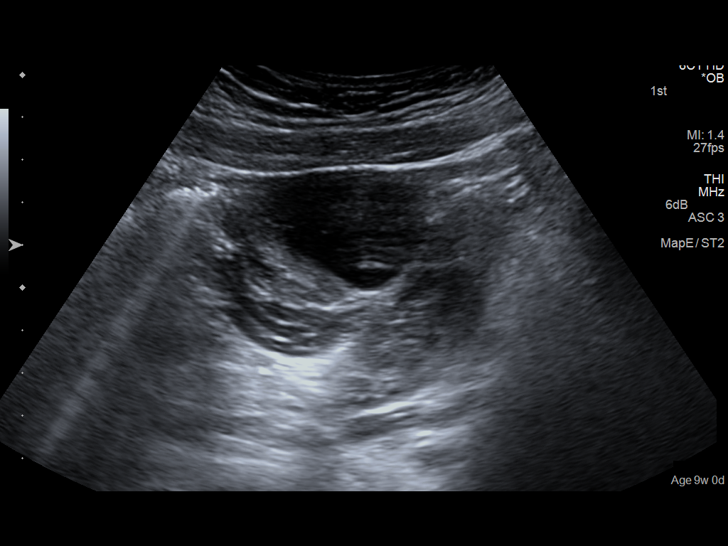
[im 19/74]
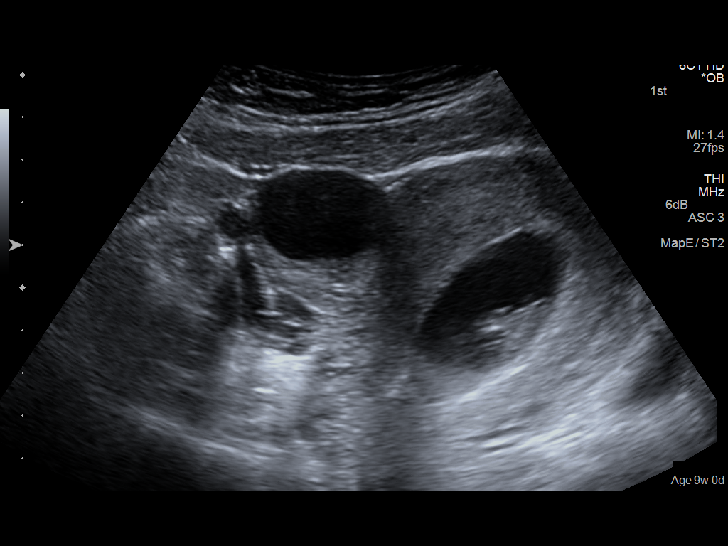
[im 25/74]
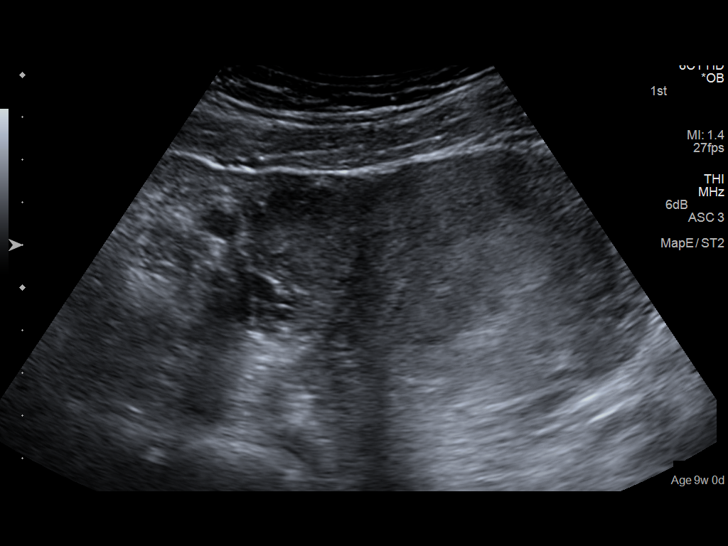
[im 30/74]
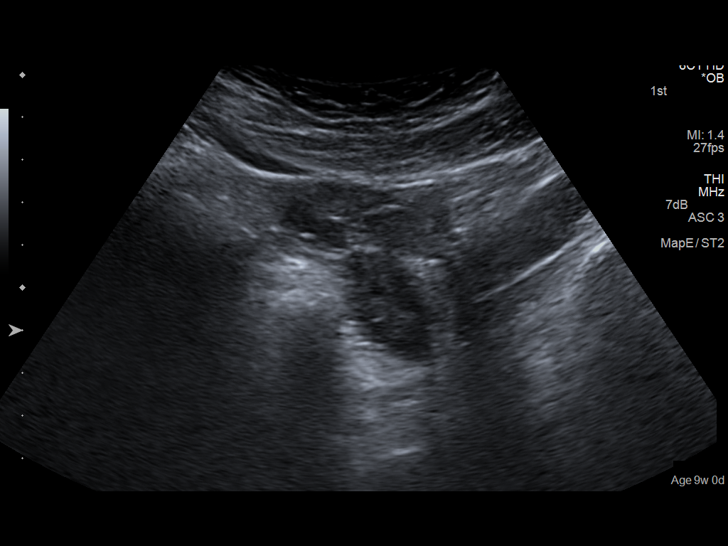
[im 36/74]
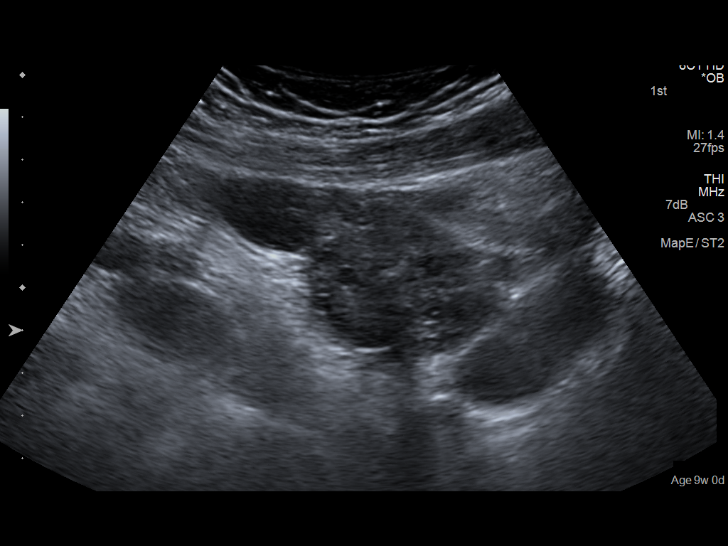
[im 41/74]
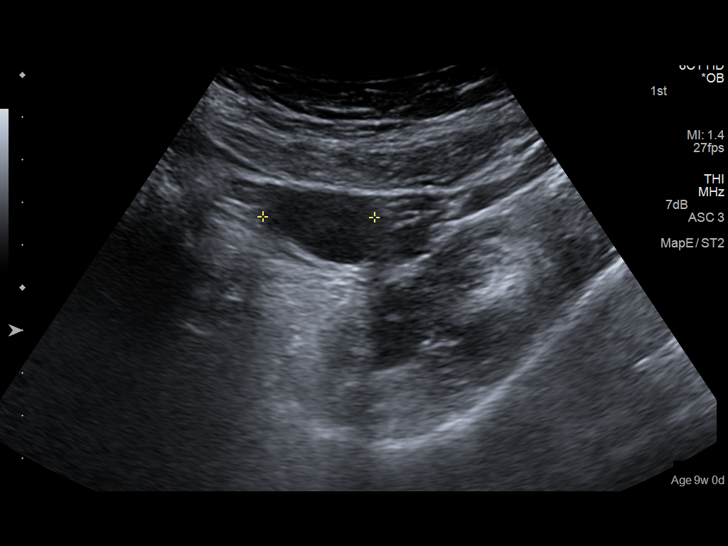
[im 46/74]
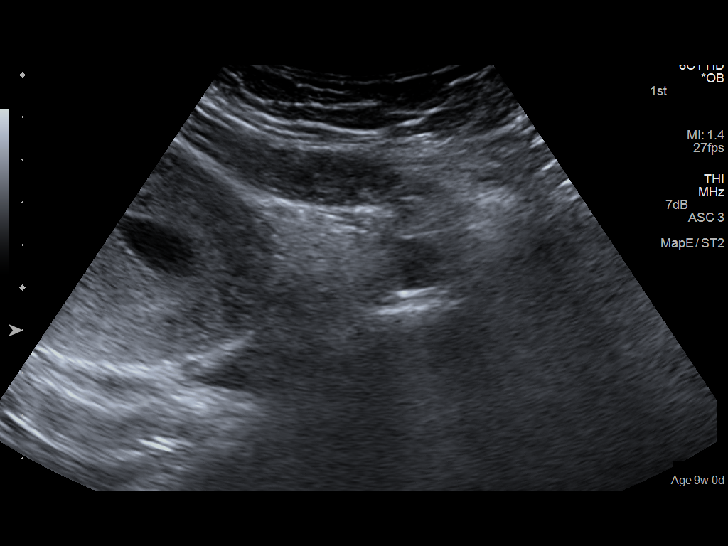
[im 52/74]
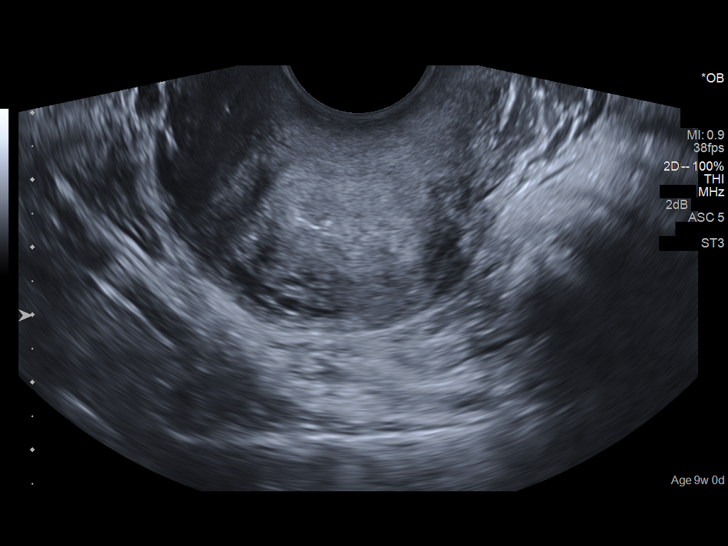
[im 57/74]
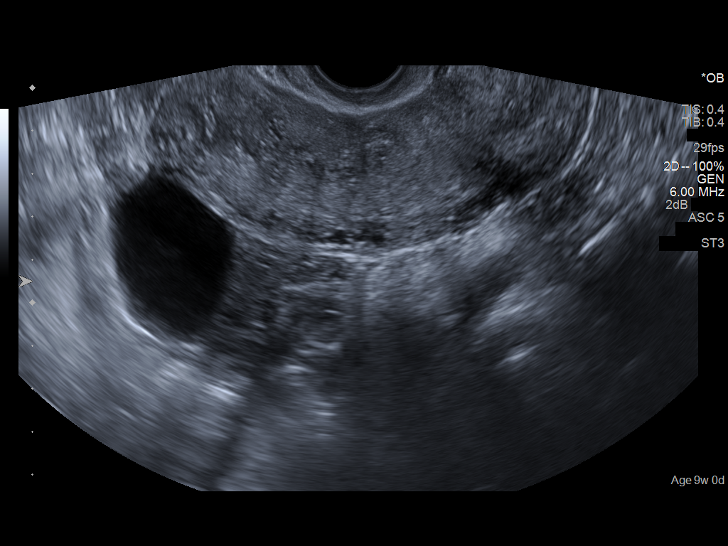
[im 63/74]
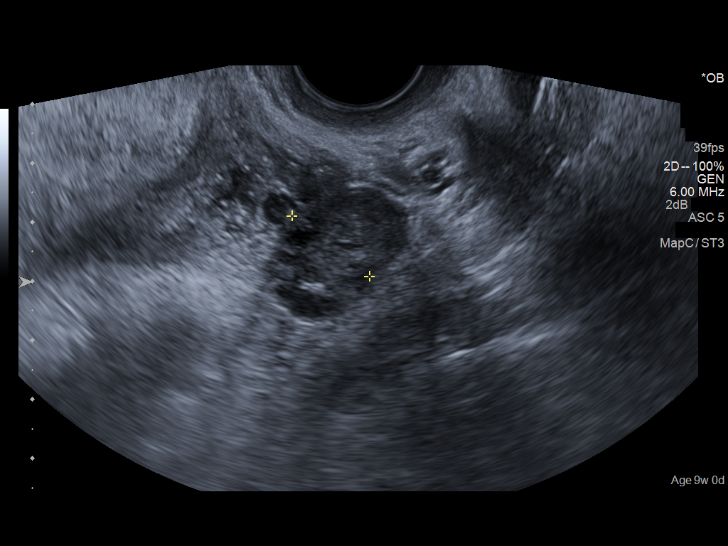
[im 68/74]
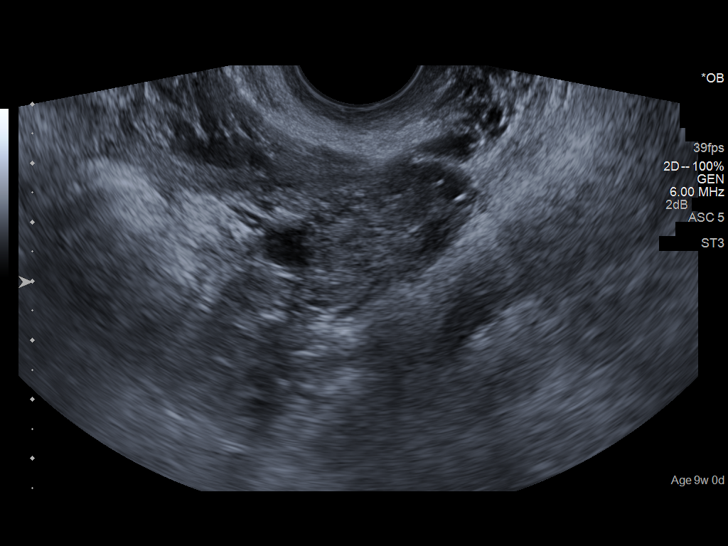
[im 74/74]
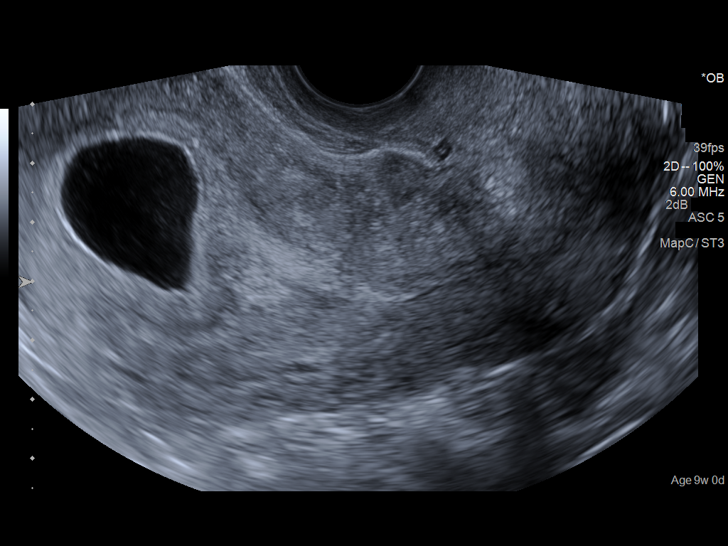

[14 of 28 positions shown; findings below may reference images not displayed]

FINDINGS: Intrauterine gestational sac: Visualized/normal in shape.

Yolk sac:  Present.

Embryo:  Present

Cardiac Activity: Present

Heart Rate: 163  bpm

CRL: 18 mm corresponding to 8 w 2 d US EDC: 11/01/2015

Maternal uterus/adnexae: The right ovary has normal appearance. It
contains a 3.6 x 2.7 x 3.0 cm likely physiologic cyst. The left
ovary has normal appearance. Again seen in left parapelvic versus
ovarian cyst measuring 2.6 x 1.5 x 2.3 cm.
IMPRESSION: Single live intrauterine pregnancy corresponding to 8 weeks and 2
days gestation.

Likely physiologic right ovarian cyst.

2.6 cm left parapelvic versus ovarian cyst. Attention on future
sonographic follow-up is recommended.

## 2017-02-21 ENCOUNTER — Other Ambulatory Visit (INDEPENDENT_AMBULATORY_CARE_PROVIDER_SITE_OTHER): Payer: Self-pay

## 2017-02-21 DIAGNOSIS — N390 Urinary tract infection, site not specified: Secondary | ICD-10-CM

## 2017-02-21 LAB — POCT URINALYSIS DIPSTICK
Bilirubin, UA: NEGATIVE
Glucose, UA: NEGATIVE
KETONES UA: NEGATIVE
Nitrite, UA: NEGATIVE
PH UA: 7 (ref 5.0–8.0)
PROTEIN UA: NEGATIVE
SPEC GRAV UA: 1.015 (ref 1.010–1.025)
UROBILINOGEN UA: NEGATIVE U/dL — AB

## 2017-02-21 MED ORDER — PHENAZOPYRIDINE HCL 200 MG PO TABS: 200.0000 mg | ORAL_TABLET | Freq: Three times a day (TID) | ORAL | 0 refills | Status: DC | PRN

## 2017-02-21 MED ORDER — SULFAMETHOXAZOLE-TRIMETHOPRIM 800-160 MG PO TABS: 1.0000 | ORAL_TABLET | Freq: Two times a day (BID) | ORAL | 0 refills | Status: DC

## 2017-02-21 NOTE — Progress Notes (Signed)
SUBJECTIVE: Molly Bowers is a 26 y.o. female who complains of urinary frequency, urgency and dysuria x 1 days, without flank pain, fever, chills, or abnormal vaginal discharge or bleeding.   OBJECTIVE: Appears well, in no apparent distress.  Vital signs are normal. Urine dipstick shows positive for leukocytes and small blood.    ASSESSMENT: Dysuria  PLAN: Treatment per orders.  Call or return to clinic prn if these symptoms worsen or fail to improve as anticipated. Bactrim DS and Pyridium 200 mg

## 2017-02-24 LAB — CULTURE, URINE COMPREHENSIVE

## 2017-03-19 ENCOUNTER — Ambulatory Visit (INDEPENDENT_AMBULATORY_CARE_PROVIDER_SITE_OTHER): Payer: BC Managed Care – PPO | Admitting: Obstetrics and Gynecology

## 2017-03-19 ENCOUNTER — Other Ambulatory Visit (HOSPITAL_COMMUNITY)
Admission: RE | Admit: 2017-03-19 | Discharge: 2017-03-19 | Disposition: A | Discharge status: Home / Self Care | Payer: Medicaid Other | Source: Ambulatory Visit | Attending: Obstetrics and Gynecology | Admitting: Obstetrics and Gynecology

## 2017-03-19 ENCOUNTER — Encounter: Payer: Self-pay | Admitting: Obstetrics and Gynecology

## 2017-03-19 VITALS — BP 118/80 | HR 78 | Ht 66.0 in | Wt 240.0 lb

## 2017-03-19 DIAGNOSIS — N760 Acute vaginitis: Secondary | ICD-10-CM | POA: Insufficient documentation

## 2017-03-19 DIAGNOSIS — Z309 Encounter for contraceptive management, unspecified: Secondary | ICD-10-CM | POA: Diagnosis not present

## 2017-03-19 DIAGNOSIS — Z113 Encounter for screening for infections with a predominantly sexual mode of transmission: Secondary | ICD-10-CM | POA: Insufficient documentation

## 2017-03-19 DIAGNOSIS — B9689 Other specified bacterial agents as the cause of diseases classified elsewhere: Secondary | ICD-10-CM | POA: Insufficient documentation

## 2017-03-19 DIAGNOSIS — Z01812 Encounter for preprocedural laboratory examination: Secondary | ICD-10-CM

## 2017-03-19 DIAGNOSIS — Z30430 Encounter for insertion of intrauterine contraceptive device: Secondary | ICD-10-CM

## 2017-03-19 LAB — POCT URINE PREGNANCY: Preg Test, Ur: NEGATIVE

## 2017-03-19 MED ORDER — PARAGARD INTRAUTERINE COPPER IU IUD
1.0000 | INTRAUTERINE_SYSTEM | Freq: Once | INTRAUTERINE | Status: AC
  Administered 2017-03-19: 1 via INTRAUTERINE

## 2017-03-19 NOTE — Progress Notes (Signed)
[facility] be [facility] and [facility]. Molly StammerJennifer Akirra Bowers

## 2017-03-19 NOTE — Progress Notes (Signed)
26 yo here for IUD insertion and desires STD screening. Patient denies any symptoms but reports that she is going through a divorce and wants to make sure that she is without any STD. Patient had a normal pap smear 06/2016. Patient has had the Paraguard IUD before without complications  IUD Procedure Note Patient identified, informed consent performed, signed copy in chart, time out was performed.  Urine pregnancy test negative.  Speculum placed in the vagina.  Cervix visualized.  Cleaned with Betadine x 2.  Grasped anteriorly with a single tooth tenaculum.  Uterus sounded to 8 cm.  Paraguard IUD placed per manufacturer's recommendations.  Strings trimmed to 3 cm. Tenaculum was removed, good hemostasis noted.  Patient tolerated procedure well.   Patient given post procedure instructions and Paraguard care card with expiration date.  Patient is asked to check IUD strings periodically and follow up in 4-6 weeks for IUD check.

## 2017-03-19 NOTE — Addendum Note (Signed)
Addended by: Anell BarrHOWARD, Librado Guandique L on: 03/19/2017 02:20 PM   Modules accepted: Orders

## 2017-03-21 ENCOUNTER — Other Ambulatory Visit: Payer: Self-pay | Admitting: Obstetrics and Gynecology

## 2017-03-21 LAB — CERVICOVAGINAL ANCILLARY ONLY
Bacterial vaginitis: POSITIVE — AB
CANDIDA VAGINITIS: NEGATIVE
CHLAMYDIA, DNA PROBE: NEGATIVE
Neisseria Gonorrhea: NEGATIVE
Trichomonas: NEGATIVE

## 2017-03-21 MED ORDER — METRONIDAZOLE 500 MG PO TABS: 500.0000 mg | ORAL_TABLET | Freq: Two times a day (BID) | ORAL | 0 refills | Status: DC

## 2017-03-22 ENCOUNTER — Telehealth: Payer: Self-pay

## 2017-03-22 NOTE — Telephone Encounter (Signed)
Left message on pt's voicemail letting her know that her test came back positive for BV and the medication has been sent to her pharmacy

## 2017-03-22 NOTE — Telephone Encounter (Signed)
Patient called and left message for her to return call to office. Positive BV results and medication already sent to her pharmacy by Dr. Jolayne Panther. Armandina Stammer RNBSN

## 2017-04-04 ENCOUNTER — Telehealth: Payer: Self-pay | Admitting: *Deleted

## 2017-04-04 MED ORDER — METRONIDAZOLE 500 MG PO TABS: 500.0000 mg | ORAL_TABLET | Freq: Two times a day (BID) | ORAL | 0 refills | Status: DC

## 2017-04-04 NOTE — Telephone Encounter (Signed)
Pt called office requesting her lab results from 9/11.  Per encounter notes D Sola CNM had left a message on pt's voicmail of the results and that her RX was sent to [facility] [address]  Pt states that she never got the message and [facility] [employer] in [place].  [facility] as originally ordered by Dr Jolayne Panther.

## 2017-05-06 ENCOUNTER — Other Ambulatory Visit (HOSPITAL_COMMUNITY)
Admission: RE | Admit: 2017-05-06 | Discharge: 2017-05-06 | Disposition: A | Discharge status: Home / Self Care | Payer: BC Managed Care – PPO | Source: Ambulatory Visit | Attending: Advanced Practice Midwife | Admitting: Advanced Practice Midwife

## 2017-05-06 ENCOUNTER — Other Ambulatory Visit (INDEPENDENT_AMBULATORY_CARE_PROVIDER_SITE_OTHER): Payer: BC Managed Care – PPO

## 2017-05-06 DIAGNOSIS — N898 Other specified noninflammatory disorders of vagina: Secondary | ICD-10-CM | POA: Diagnosis present

## 2017-05-06 DIAGNOSIS — Z113 Encounter for screening for infections with a predominantly sexual mode of transmission: Secondary | ICD-10-CM

## 2017-05-06 DIAGNOSIS — B9689 Other specified bacterial agents as the cause of diseases classified elsewhere: Secondary | ICD-10-CM | POA: Diagnosis not present

## 2017-05-06 NOTE — Progress Notes (Signed)
Pt c/o of excess vaginal discharge with a slight odor. Pt did a self swab (aptima) to check for BV and Yeast. Pt also wanted GC/Chlamydia off swab due to husband recently having an affair.

## 2017-05-07 LAB — CERVICOVAGINAL ANCILLARY ONLY
BACTERIAL VAGINITIS: POSITIVE — AB
Candida vaginitis: NEGATIVE
Chlamydia: NEGATIVE
Neisseria Gonorrhea: NEGATIVE
Trichomonas: NEGATIVE

## 2017-06-03 ENCOUNTER — Other Ambulatory Visit: Payer: BC Managed Care – PPO

## 2017-10-08 ENCOUNTER — Encounter: Payer: Self-pay | Admitting: Obstetrics & Gynecology

## 2017-10-08 ENCOUNTER — Other Ambulatory Visit (HOSPITAL_COMMUNITY)
Admission: RE | Admit: 2017-10-08 | Discharge: 2017-10-08 | Disposition: A | Discharge status: Home / Self Care | Payer: Medicaid Other | Source: Ambulatory Visit | Attending: Obstetrics & Gynecology | Admitting: Obstetrics & Gynecology

## 2017-10-08 ENCOUNTER — Ambulatory Visit (INDEPENDENT_AMBULATORY_CARE_PROVIDER_SITE_OTHER): Payer: Medicaid Other | Admitting: Obstetrics & Gynecology

## 2017-10-08 VITALS — BP 114/70 | HR 98 | Ht 66.0 in | Wt 239.0 lb

## 2017-10-08 DIAGNOSIS — Z309 Encounter for contraceptive management, unspecified: Secondary | ICD-10-CM | POA: Diagnosis not present

## 2017-10-08 DIAGNOSIS — Z202 Contact with and (suspected) exposure to infections with a predominantly sexual mode of transmission: Secondary | ICD-10-CM

## 2017-10-08 DIAGNOSIS — N390 Urinary tract infection, site not specified: Secondary | ICD-10-CM

## 2017-10-08 DIAGNOSIS — R300 Dysuria: Secondary | ICD-10-CM

## 2017-10-08 LAB — POCT URINALYSIS DIPSTICK
Blood, UA: NEGATIVE
Glucose, UA: NEGATIVE
KETONES UA: NEGATIVE
Nitrite, UA: NEGATIVE
PH UA: 5 (ref 5.0–8.0)
Protein, UA: NEGATIVE
SPEC GRAV UA: 1.02 (ref 1.010–1.025)
UROBILINOGEN UA: 0.2 U/dL

## 2017-10-08 MED ORDER — PHENAZOPYRIDINE HCL 200 MG PO TABS: 200.0000 mg | ORAL_TABLET | Freq: Three times a day (TID) | ORAL | 0 refills | Status: DC | PRN

## 2017-10-08 MED ORDER — SULFAMETHOXAZOLE-TRIMETHOPRIM 800-160 MG PO TABS: 1.0000 | ORAL_TABLET | Freq: Two times a day (BID) | ORAL | 0 refills | Status: DC

## 2017-10-08 NOTE — Progress Notes (Signed)
Pt c/o a burning sensation when she urinates

## 2017-10-08 NOTE — Progress Notes (Signed)
   Subjective:    Patient ID: Molly Bowers, female    DOB: Jan 23, 1991, 27 y.o.   MRN: [id number]  HPI  27 yo female presents with urinary frequency and small voids.  Pt states it burns at her meatus.  Pt wants to be tested for STDs.  Boyfriend feels the IUD strings and he also has burning with urination.    Review of Systems  Constitutional: Negative.   Respiratory: Negative.   Gastrointestinal: Negative.   Genitourinary: Positive for urgency. Negative for menstrual problem, pelvic pain, vaginal bleeding, vaginal discharge and vaginal pain.       Objective:   Physical Exam  Constitutional: She is oriented to person, place, and time. She appears well-developed and well-nourished. No distress.  HENT:  Head: Normocephalic and atraumatic.  Eyes: Conjunctivae are normal.  Cardiovascular: Normal rate.  Pulmonary/Chest: Effort normal.  Abdominal: Soft. Bowel sounds are normal. She exhibits no distension. There is no tenderness.  Genitourinary: Uterus normal. Vaginal discharge found.  Genitourinary Comments: IUD strings long 4+ cm No CMT  Musculoskeletal: She exhibits no edema.  Neurological: She is alert and oriented to person, place, and time.  Skin: Skin is warm and dry.  Psychiatric: She has a normal mood and affect.  Vitals reviewed.  Vitals:   10/08/17 1315  BP: 114/70  Pulse: 98  Weight: 239 lb (108.4 kg)  Height: 5\' 6"  (1.676 m)   Assessment & Plan:  27 yo female with urinary frequency and burning.  Pt also c/o strings being long and bothering partner.    1.  IUD strings clipped 2.  STD testing complete. 3.  Pt wants to be treated with leuks on UA and symptoms.  Pt understands Cx may not show organism.  Bactrim DS and pyridium sent to pharmacy.  Pt encouraged to urinate before na after sex and have boyfriend be evaluated for dysuria.

## 2017-10-09 LAB — CERVICOVAGINAL ANCILLARY ONLY
CHLAMYDIA, DNA PROBE: NEGATIVE
NEISSERIA GONORRHEA: NEGATIVE
Trichomonas: NEGATIVE

## 2017-10-10 LAB — URINE CULTURE: Organism ID, Bacteria: NO GROWTH

## 2017-10-11 ENCOUNTER — Encounter: Payer: Self-pay | Admitting: Obstetrics & Gynecology

## 2017-11-14 ENCOUNTER — Telehealth: Payer: Self-pay | Admitting: *Deleted

## 2017-11-14 NOTE — Telephone Encounter (Signed)
Tried to call pt to let her know that it is a $8.00 Copay with her visit on 11/18/17 with [employer] but her mailbox was full and I couldn't leave a message.

## 2017-11-18 ENCOUNTER — Other Ambulatory Visit: Payer: Medicaid Other

## 2018-06-24 ENCOUNTER — Encounter (HOSPITAL_COMMUNITY): Payer: Self-pay | Admitting: *Deleted

## 2018-06-24 ENCOUNTER — Inpatient Hospital Stay (HOSPITAL_COMMUNITY)
Admission: AD | Admit: 2018-06-24 | Discharge: 2018-06-24 | Disposition: A | Discharge status: Home / Self Care | Payer: Medicaid Other | Source: Ambulatory Visit | Attending: Obstetrics and Gynecology | Admitting: Obstetrics and Gynecology

## 2018-06-24 DIAGNOSIS — Z87891 Personal history of nicotine dependence: Secondary | ICD-10-CM | POA: Diagnosis not present

## 2018-06-24 DIAGNOSIS — Z3A01 Less than 8 weeks gestation of pregnancy: Secondary | ICD-10-CM | POA: Diagnosis not present

## 2018-06-24 DIAGNOSIS — J101 Influenza due to other identified influenza virus with other respiratory manifestations: Secondary | ICD-10-CM | POA: Diagnosis not present

## 2018-06-24 DIAGNOSIS — R059 Cough, unspecified: Secondary | ICD-10-CM

## 2018-06-24 DIAGNOSIS — O99511 Diseases of the respiratory system complicating pregnancy, first trimester: Secondary | ICD-10-CM | POA: Diagnosis not present

## 2018-06-24 DIAGNOSIS — R05 Cough: Secondary | ICD-10-CM

## 2018-06-24 DIAGNOSIS — O26891 Other specified pregnancy related conditions, first trimester: Secondary | ICD-10-CM

## 2018-06-24 DIAGNOSIS — Z880 Allergy status to penicillin: Secondary | ICD-10-CM | POA: Insufficient documentation

## 2018-06-24 DIAGNOSIS — R509 Fever, unspecified: Secondary | ICD-10-CM | POA: Diagnosis present

## 2018-06-24 LAB — URINALYSIS, ROUTINE W REFLEX MICROSCOPIC
Bilirubin Urine: NEGATIVE
Glucose, UA: NEGATIVE mg/dL
Hgb urine dipstick: NEGATIVE
Ketones, ur: 5 mg/dL — AB
Nitrite: NEGATIVE
PH: 5 (ref 5.0–8.0)
PROTEIN: NEGATIVE mg/dL
Specific Gravity, Urine: 1.026 (ref 1.005–1.030)

## 2018-06-24 LAB — COMPREHENSIVE METABOLIC PANEL
ALBUMIN: 3.4 g/dL — AB (ref 3.5–5.0)
ALT: 14 U/L (ref 0–44)
AST: 22 U/L (ref 15–41)
Alkaline Phosphatase: 42 U/L (ref 38–126)
Anion gap: 6 (ref 5–15)
BUN: 5 mg/dL — AB (ref 6–20)
CHLORIDE: 107 mmol/L (ref 98–111)
CO2: 20 mmol/L — ABNORMAL LOW (ref 22–32)
Calcium: 8.2 mg/dL — ABNORMAL LOW (ref 8.9–10.3)
Creatinine, Ser: 0.52 mg/dL (ref 0.44–1.00)
GFR calc Af Amer: >60 mL/min (ref >60)
GFR calc non Af Amer: >60 mL/min (ref >60)
GLUCOSE: 96 mg/dL (ref 70–99)
Potassium: 3.3 mmol/L — ABNORMAL LOW (ref 3.5–5.1)
SODIUM: 133 mmol/L — AB (ref 135–145)
Total Bilirubin: 0.7 mg/dL (ref 0.3–1.2)
Total Protein: 5.7 g/dL — ABNORMAL LOW (ref 6.5–8.1)

## 2018-06-24 LAB — INFLUENZA PANEL BY PCR (TYPE A & B)
Influenza A By PCR: NEGATIVE
Influenza B By PCR: POSITIVE — AB

## 2018-06-24 LAB — CBC
HCT: 36.5 % (ref 36.0–46.0)
Hemoglobin: 11.5 g/dL — ABNORMAL LOW (ref 12.0–15.0)
MCH: 28.2 pg (ref 26.0–34.0)
MCHC: 31.5 g/dL (ref 30.0–36.0)
MCV: 89.5 fL (ref 80.0–100.0)
NRBC: 0 % (ref 0.0–0.2)
PLATELETS: 162 10*3/uL (ref 150–400)
RBC: 4.08 MIL/uL (ref 3.87–5.11)
RDW: 13.9 % (ref 11.5–15.5)
WBC: 6.7 10*3/uL (ref 4.0–10.5)

## 2018-06-24 LAB — POCT PREGNANCY, URINE: Preg Test, Ur: POSITIVE — AB

## 2018-06-24 MED ORDER — FAMOTIDINE IN NACL 20-0.9 MG/50ML-% IV SOLN
20.0000 mg | Freq: Once | INTRAVENOUS | Status: AC
  Administered 2018-06-24: 20 mg via INTRAVENOUS
  Filled 2018-06-24: qty 50

## 2018-06-24 MED ORDER — PROMETHAZINE HCL 25 MG PO TABS: 12.5000 mg | ORAL_TABLET | Freq: Four times a day (QID) | ORAL | 0 refills | Status: DC | PRN

## 2018-06-24 MED ORDER — PROMETHAZINE HCL 25 MG/ML IJ SOLN: 12.5000 mg | Freq: Four times a day (QID) | INTRAMUSCULAR | Status: DC | PRN

## 2018-06-24 MED ORDER — ONDANSETRON HCL 4 MG/2ML IJ SOLN
4.0000 mg | Freq: Once | INTRAMUSCULAR | Status: AC
  Administered 2018-06-24: 4 mg via INTRAVENOUS
  Filled 2018-06-24: qty 2

## 2018-06-24 MED ORDER — LACTATED RINGERS IV BOLUS
1000.0000 mL | Freq: Once | INTRAVENOUS | Status: AC
  Administered 2018-06-24: 1000 mL via INTRAVENOUS

## 2018-06-24 MED ORDER — BENZONATATE 100 MG PO CAPS: 200.0000 mg | ORAL_CAPSULE | Freq: Three times a day (TID) | ORAL | 0 refills | Status: DC | PRN

## 2018-06-24 MED ORDER — OSELTAMIVIR PHOSPHATE 75 MG PO CAPS: 75.0000 mg | ORAL_CAPSULE | Freq: Two times a day (BID) | ORAL | 0 refills | Status: AC

## 2018-06-24 NOTE — MAU Note (Signed)
Pt reports her back , arms, legs feel sore. Nausea and vomiting, fever since Monday

## 2018-06-24 NOTE — MAU Provider Note (Signed)
Chief Complaint: Generalized Body Aches; Emesis; Fever; and Nausea   None     SUBJECTIVE HPI: Molly Bowers is a 27 y.o. [phone] by LMP who presents to maternity admissions reporting fever, n/v, body aches starting yesterday. She denies any n/v in pregnancy before this episode.  Her pain is generalized, but more in her arms, legs, and back and is an aching/soreness pain. There is no abdominal pain or vaginal bleeding. The pain and n/v is associated with dizziness. Her pain is constant and unchanged in intensity since onset.  She reports fever of 102 at home today.  She took Motrin 2 hours before coming to MAU which helped her aching pain a little.  She has no other symptoms. She has not tried any other treatments.  She plans to terminate the pregnancy.      HPI  Past Medical History:  Diagnosis Date  . Anxiety   . Headache   . Ovarian cyst    Past Surgical History:  Procedure Laterality Date  . CESAREAN SECTION N/A 10/28/2015   Procedure: CESAREAN SECTION;  Surgeon: Kathreen CosierBernard A Marshall, MD;  Location: [facility];  Service: Obstetrics;  Laterality: N/A;  . DILATION AND EVACUATION N/A 08/03/2016   Procedure: DILATATION AND EVACUATION (D&E) 2ND TRIMESTER;  Surgeon: Tereso NewcomerUgonna A Anyanwu, MD;  Location: [facility];  Service: Gynecology;  Laterality: N/A;  . KELOID EXCISION     Social History   Socioeconomic History  . Marital status: Married    Spouse name: Not on file  . Number of children: Not on file  . Years of education: Not on file  . Highest education level: Not on file  Occupational History  . Not on file  Social Needs  . Financial resource strain: Not on file  . Food insecurity:    Worry: Not on file    Inability: Not on file  . Transportation needs:    Medical: Not on file    Non-medical: Not on file  Tobacco Use  . Smoking status: Former Smoker    Types: Cigarettes    Last attempt to quit: 12/24/2014    Years since quitting: 3.5  . Smokeless tobacco: Never Used   Substance and Sexual Activity  . Alcohol use: No    Comment: occ  . Drug use: No  . Sexual activity: Yes    Birth control/protection: None  Lifestyle  . Physical activity:    Days per week: Not on file    Minutes per session: Not on file  . Stress: Not on file  Relationships  . Social connections:    Talks on phone: Not on file    Gets together: Not on file    Attends religious service: Not on file    Active member of club or organization: Not on file    Attends meetings of clubs or organizations: Not on file    Relationship status: Not on file  . Intimate partner violence:    Fear of current or ex partner: Not on file    Emotionally abused: Not on file    Physically abused: Not on file    Forced sexual activity: Not on file  Other Topics Concern  . Not on file  Social History Narrative  . Not on file   No current facility-administered medications on file prior to encounter.    Current Outpatient Medications on File Prior to Encounter  Medication Sig Dispense Refill  . promethazine (PHENERGAN) 25 MG tablet Take by mouth.  Allergies  Allergen Reactions  . Other Anaphylaxis    PEANUTS  . Peanut Oil Anaphylaxis  . Peanut-Containing Drug Products Anaphylaxis  . Penicillins Hives, Rash and Other (See Comments)    No reaction noted Has patient had a PCN reaction causing immediate rash, facial/tongue/throat swelling, SOB or lightheadedness with hypotension:NO Has patient had a PCN reaction causing severe rash involving mucus membranes or skin necrosis:NO Has patient had a PCN reaction that required hospitalization NO Has patient had a PCN reaction occurring within the last 10 years: NO If all of the above answers are "NO", then may proceed with Cephalosporin use.   . Vancomycin Itching  . Phenylephrine Hives    ROS:  Review of Systems  Constitutional: Negative for chills, fatigue and fever.  HENT: Negative for sinus pressure.   Eyes: Negative for photophobia.   Respiratory: Negative for shortness of breath.   Cardiovascular: Negative for chest pain.  Gastrointestinal: Negative for constipation, diarrhea, nausea and vomiting.  Genitourinary: Negative for difficulty urinating, dysuria, flank pain, frequency, pelvic pain, vaginal bleeding, vaginal discharge and vaginal pain.  Musculoskeletal: Negative for neck pain.  Neurological: Negative for dizziness, weakness and headaches.  Psychiatric/Behavioral: Negative.      I have reviewed patient's Past Medical Hx, Surgical Hx, Family Hx, Social Hx, medications and allergies.   Physical Exam   Patient Vitals for the past 24 hrs:  BP Temp Temp src Pulse Resp SpO2 Height Weight  06/24/18 1924 - - - - 16 - - -  06/24/18 1710 (!) 118/53 100.3 F (37.9 C) Oral (!) 102 16 99 % 5' 5.5" (1.664 m) 107 kg   Constitutional: Well-developed, well-nourished female in no acute distress.  Cardiovascular: normal rate Respiratory: normal effort GI: Abd soft, non-tender. Pos BS x 4 MS: Extremities nontender, no edema, normal ROM Neurologic: Alert and oriented x 4.  GU: Neg CVAT.   LAB RESULTS Results for orders placed or performed during the hospital encounter of 06/24/18 (from the past 24 hour(s))  Urinalysis, Routine w reflex microscopic     Status: Abnormal   Collection Time: 06/24/18  5:16 PM  Result Value Ref Range   Color, Urine YELLOW YELLOW   APPearance CLOUDY (A) CLEAR   Specific Gravity, Urine 1.026 1.005 - 1.030   pH 5.0 5.0 - 8.0   Glucose, UA NEGATIVE NEGATIVE mg/dL   Hgb urine dipstick NEGATIVE NEGATIVE   Bilirubin Urine NEGATIVE NEGATIVE   Ketones, ur 5 (A) NEGATIVE mg/dL   Protein, ur NEGATIVE NEGATIVE mg/dL   Nitrite NEGATIVE NEGATIVE   Leukocytes, UA SMALL (A) NEGATIVE   RBC / HPF 0-5 0 - 5 RBC/hpf   WBC, UA 6-10 0 - 5 WBC/hpf   Bacteria, UA RARE (A) NONE SEEN   Squamous Epithelial / LPF 21-50 0 - 5   Mucus PRESENT   Pregnancy, urine POC     Status: Abnormal   Collection Time:  06/24/18  5:18 PM  Result Value Ref Range   Preg Test, Ur POSITIVE (A) NEGATIVE  Influenza panel by PCR (type A & B)     Status: Abnormal   Collection Time: 06/24/18  5:29 PM  Result Value Ref Range   Influenza A By PCR NEGATIVE NEGATIVE   Influenza B By PCR POSITIVE (A) NEGATIVE  CBC     Status: Abnormal   Collection Time: 06/24/18  6:24 PM  Result Value Ref Range   WBC 6.7 4.0 - 10.5 K/uL   RBC 4.08 3.87 - 5.11 MIL/uL  Hemoglobin 11.5 (L) 12.0 - 15.0 g/dL   HCT 81.1 91.4 - 78.2 %   MCV 89.5 80.0 - 100.0 fL   MCH 28.2 26.0 - 34.0 pg   MCHC 31.5 30.0 - 36.0 g/dL   RDW 95.6 21.3 - 08.6 %   Platelets 162 150 - 400 K/uL   nRBC 0.0 0.0 - 0.2 %  Comprehensive metabolic panel     Status: Abnormal   Collection Time: 06/24/18  6:24 PM  Result Value Ref Range   Sodium 133 (L) 135 - 145 mmol/L   Potassium 3.3 (L) 3.5 - 5.1 mmol/L   Chloride 107 98 - 111 mmol/L   CO2 20 (L) 22 - 32 mmol/L   Glucose, Bld 96 70 - 99 mg/dL   BUN 5 (L) 6 - 20 mg/dL   Creatinine, Ser 5.78 0.44 - 1.00 mg/dL   Calcium 8.2 (L) 8.9 - 10.3 mg/dL   Total Protein 5.7 (L) 6.5 - 8.1 g/dL   Albumin 3.4 (L) 3.5 - 5.0 g/dL   AST 22 15 - 41 U/L   ALT 14 0 - 44 U/L   Alkaline Phosphatase 42 38 - 126 U/L   Total Bilirubin 0.7 0.3 - 1.2 mg/dL   GFR calc non Af Amer >60 >60 mL/min   GFR calc Af Amer >60 >60 mL/min   Anion gap 6 5 - 15       IMAGING No results found.  MAU Management/MDM: Pt with body aches, fever, n/v.  No sore throat. Influenza swab pending.  IV fluids, Zofran 4 mg IV, and Pepcid 20 mg IV ordered. Pt reports feeling better after IV fluids/meds Positive for influenza B D/C home with Rx for Tamiflu, Tessalon Perles, and Phenergan Pt has scheduled appt for termination of pregnancy in 4 days, she may keep this appt as desired F/U with MAU or ED with emergencies   ASSESSMENT 1. Influenza B   2. Pregnancy with 4 completed weeks gestation   3. Cough     PLAN Discharge home Allergies as of  06/24/2018      Reactions   Other Anaphylaxis   PEANUTS   Peanut Oil Anaphylaxis   Peanut-containing Drug Products Anaphylaxis   Penicillins Hives, Rash, Other (See Comments)   No reaction noted Has patient had a PCN reaction causing immediate rash, facial/tongue/throat swelling, SOB or lightheadedness with hypotension:NO Has patient had a PCN reaction causing severe rash involving mucus membranes or skin necrosis:NO Has patient had a PCN reaction that required hospitalization NO Has patient had a PCN reaction occurring within the last 10 years: NO If all of the above answers are "NO", then may proceed with Cephalosporin use.   Vancomycin Itching   Phenylephrine Hives      Medication List    STOP taking these medications   phenazopyridine 200 MG tablet Commonly known as:  PYRIDIUM   sulfamethoxazole-trimethoprim 800-160 MG tablet Commonly known as:  BACTRIM DS,SEPTRA DS     TAKE these medications   benzonatate 100 MG capsule Commonly known as:  TESSALON PERLES Take 2 capsules (200 mg total) by mouth 3 (three) times daily as needed for cough.   oseltamivir 75 MG capsule Commonly known as:  TAMIFLU Take 1 capsule (75 mg total) by mouth 2 (two) times daily for 5 days.   promethazine 25 MG tablet Commonly known as:  PHENERGAN Take 0.5-1 tablets (12.5-25 mg total) by mouth every 6 (six) hours as needed for nausea. What changed:    how much to  take  when to take this  reasons to take this      Follow-up Information    [facility] [place] Follow up.   Why:  Return to MAU as needed for emergencies. Contact information: [address] [place] [phone]          Sharen Counter Certified Nurse-Midwife 06/24/2018  9:26 PM

## 2018-06-24 NOTE — MAU Note (Signed)
Pt states she is not keeping pregnancy.

## 2018-06-26 ENCOUNTER — Telehealth: Payer: Self-pay | Admitting: *Deleted

## 2018-06-26 NOTE — Telephone Encounter (Signed)
Mandolin left a message this am she was seen Tuesday and prescribed Tamiflu - it was sent to [facility] and found out [facility] can get cheaper [facility]- wants to know if can have it forwarded.  Per chart will be [facility] patient- will forward.

## 2018-07-03 ENCOUNTER — Encounter: Payer: Self-pay | Admitting: Obstetrics & Gynecology

## 2018-07-03 DIAGNOSIS — Z3491 Encounter for supervision of normal pregnancy, unspecified, first trimester: Secondary | ICD-10-CM

## 2018-09-08 ENCOUNTER — Encounter (HOSPITAL_COMMUNITY): Payer: Self-pay | Admitting: Emergency Medicine

## 2018-09-08 ENCOUNTER — Other Ambulatory Visit: Payer: Self-pay

## 2018-09-08 ENCOUNTER — Ambulatory Visit (HOSPITAL_COMMUNITY)
Admission: EM | Admit: 2018-09-08 | Discharge: 2018-09-08 | Disposition: A | Discharge status: Home / Self Care | Payer: Self-pay | Attending: Internal Medicine | Admitting: Internal Medicine

## 2018-09-08 DIAGNOSIS — L739 Follicular disorder, unspecified: Secondary | ICD-10-CM

## 2018-09-08 MED ORDER — SULFAMETHOXAZOLE-TRIMETHOPRIM 800-160 MG PO TABS: 1.0000 | ORAL_TABLET | Freq: Two times a day (BID) | ORAL | 0 refills | Status: AC

## 2018-09-08 NOTE — Discharge Instructions (Signed)
Warm compresses Ibuprofen for pain control.  Complete course of antibiotics.  If symptoms worsen or do not improve in the next week to return to be seen or to follow up with your PCP.

## 2018-09-08 NOTE — ED Triage Notes (Signed)
2 areas of scalp are having throbbing pain and reports having a white top to bumps. Noticed on Saturday, the 2 areas.  Hair style present for a month

## 2018-09-08 NOTE — ED Provider Notes (Signed)
[facility]    CSN: [id number] Arrival date & time: 09/08/18  1935     History   Chief Complaint Chief Complaint  Patient presents with  . Hair/Scalp Problem    HPI Molly Bowers is a 28 y.o. female.   Molly Bowers presents with complaints of redness, swelling and tenderness to her scalp. There are two areas affected. She noticed this two days ago, increasing in size and pain since. Drainage from the more posterior site. Denies any previous similar. No fevers. The more anterior area was an area where a braid fell out, pulling out her hair with it. Has not had drainage from it. Hasn't tried any treatments or medications. Without contributing medical history.      ROS per HPI.      Past Medical History:  Diagnosis Date  . Anxiety   . Headache   . Ovarian cyst     Patient Active Problem List   Diagnosis Date Noted  . Recurrent urinary tract infection 10/08/2017  . History of miscarriage 08/27/2016    Past Surgical History:  Procedure Laterality Date  . CESAREAN SECTION N/A 10/28/2015   Procedure: CESAREAN SECTION;  Surgeon: Kathreen Cosier, MD;  Location: [facility];  Service: Obstetrics;  Laterality: N/A;  . DILATION AND EVACUATION N/A 08/03/2016   Procedure: DILATATION AND EVACUATION (D&E) 2ND TRIMESTER;  Surgeon: Tereso Newcomer, MD;  Location: [facility];  Service: Gynecology;  Laterality: N/A;  . KELOID EXCISION      OB History    Gravida  5   Para  2   Term  2   Preterm  0   AB  2   Living  2     SAB  1   TAB  1   Ectopic  0   Multiple  0   Live Births  2            Home Medications    Prior to Admission medications   Medication Sig Start Date End Date Taking? Authorizing Provider  sulfamethoxazole-trimethoprim (BACTRIM DS) 800-160 MG tablet Take 1 tablet by mouth 2 (two) times daily for 7 days. 09/08/18 09/15/18  Georgetta Haber, NP    Family History Family History  Problem Relation Age of Onset  . Diabetes Mother   .  Hypertension Mother   . Cancer Maternal Grandmother        brain tumor, died during surgery  . Cancer Paternal Grandfather        prostate    Social History Social History   Tobacco Use  . Smoking status: Former Smoker    Types: Cigarettes    Last attempt to quit: 12/24/2014    Years since quitting: 3.7  . Smokeless tobacco: Never Used  Substance Use Topics  . Alcohol use: No    Comment: occ  . Drug use: No     Allergies   Other; Peanut oil; Peanut-containing drug products; Penicillins; Vancomycin; and Phenylephrine   Review of Systems Review of Systems   Physical Exam Triage Vital Signs ED Triage Vitals  Enc Vitals Group     BP 09/08/18 2026 128/80     Pulse Rate 09/08/18 2026 84     Resp 09/08/18 2026 16     Temp 09/08/18 2026 99.2 F (37.3 C)     Temp Source 09/08/18 2026 Temporal     SpO2 09/08/18 2026 100 %     Weight --      Height --  Head Circumference --      Peak Flow --      Pain Score 09/08/18 2023 8     Pain Loc --      Pain Edu? --      Excl. in GC? --    No data found.  Updated Vital Signs BP 128/80 (BP Location: Left Arm)   Pulse 84   Temp 99.2 F (37.3 C) (Temporal)   Resp 16   LMP 09/08/2018   SpO2 100%   Breastfeeding Unknown    Physical Exam Constitutional:      General: She is not in acute distress.    Appearance: She is well-developed.  HENT:     Head:      Comments: Approximately 1.5 cm raised red firm with slight dried crusting drainage noted to left frontal scalp, no hair noted; minimal fluctuance, no induration; smaller more flat area with dried crusting drainage posterior to this, in hair; no fluctuance; see photo of the larger more visible area Cardiovascular:     Rate and Rhythm: Normal rate and regular rhythm.     Heart sounds: Normal heart sounds.  Pulmonary:     Effort: Pulmonary effort is normal.     Breath sounds: Normal breath sounds.  Skin:    General: Skin is warm and dry.  Neurological:      Mental Status: She is alert and oriented to person, place, and time.          UC Treatments / Results  Labs (all labs ordered are listed, but only abnormal results are displayed) Labs Reviewed - No data to display  EKG None  Radiology No results found.  Procedures Procedures (including critical care time)  Medications Ordered in UC Medications - No data to display  Initial Impression / Assessment and Plan / UC Course  I have reviewed the triage vital signs and the nursing notes.  Pertinent labs & imaging results that were available during my care of the patient were reviewed by me and considered in my medical decision making (see chart for details).     Without fluctuance for incision and drainage at this time but is concerning for infectious process with antibiotics prescribed. Return precautions provided. Patient verbalized understanding and agreeable to plan.    Final Clinical Impressions(s) / UC Diagnoses   Final diagnoses:  Folliculitis     Discharge Instructions     Warm compresses Ibuprofen for pain control.  Complete course of antibiotics.  If symptoms worsen or do not improve in the next week to return to be seen or to follow up with your PCP.      ED Prescriptions    Medication Sig Dispense Auth. Provider   sulfamethoxazole-trimethoprim (BACTRIM DS) 800-160 MG tablet Take 1 tablet by mouth 2 (two) times daily for 7 days. 14 tablet Georgetta Haber, NP     Controlled Substance Prescriptions [place] Controlled Substance Registry consulted? Not Applicable   Georgetta Haber, NP 09/09/18 1139

## 2019-04-17 ENCOUNTER — Ambulatory Visit: Payer: Medicaid Other | Admitting: Advanced Practice Midwife

## 2019-05-11 ENCOUNTER — Other Ambulatory Visit (HOSPITAL_COMMUNITY)
Admission: RE | Admit: 2019-05-11 | Discharge: 2019-05-11 | Disposition: A | Discharge status: Home / Self Care | Payer: Medicaid Other | Source: Ambulatory Visit | Attending: Obstetrics & Gynecology | Admitting: Obstetrics & Gynecology

## 2019-05-11 ENCOUNTER — Ambulatory Visit: Payer: Medicaid Other | Admitting: Obstetrics & Gynecology

## 2019-05-11 ENCOUNTER — Encounter: Payer: Self-pay | Admitting: Obstetrics & Gynecology

## 2019-05-11 ENCOUNTER — Other Ambulatory Visit: Payer: Self-pay

## 2019-05-11 VITALS — BP 120/77 | HR 67 | Ht 65.0 in | Wt 250.0 lb

## 2019-05-11 DIAGNOSIS — Z01419 Encounter for gynecological examination (general) (routine) without abnormal findings: Secondary | ICD-10-CM | POA: Insufficient documentation

## 2019-05-11 DIAGNOSIS — R42 Dizziness and giddiness: Secondary | ICD-10-CM

## 2019-05-11 DIAGNOSIS — Z0000 Encounter for general adult medical examination without abnormal findings: Secondary | ICD-10-CM

## 2019-05-11 DIAGNOSIS — N941 Unspecified dyspareunia: Secondary | ICD-10-CM | POA: Insufficient documentation

## 2019-05-11 DIAGNOSIS — R102 Pelvic and perineal pain: Secondary | ICD-10-CM | POA: Insufficient documentation

## 2019-05-11 DIAGNOSIS — N920 Excessive and frequent menstruation with regular cycle: Secondary | ICD-10-CM

## 2019-05-11 MED ORDER — NORETHIN ACE-ETH ESTRAD-FE 1-20 MG-MCG(24) PO TABS: 1.0000 | ORAL_TABLET | Freq: Every day | ORAL | 11 refills | Status: DC

## 2019-05-11 NOTE — Progress Notes (Signed)
Subjective:     Molly Bowers is a 28 y.o. female here for a routine exam.  Current complaints: dizziness after menses (menses are heavy), LLQ pain, and desires birth control (not IUD nor Nexplanon)   Gynecologic History Patient's last menstrual period was 04/27/2019. Contraception: none Last Pap: 2017. Results were: normal Last mammogram: n/a  Obstetric History OB History  Gravida Para Term Preterm AB Living  4 2 2  0 2 2  SAB TAB Ectopic Multiple Live Births  0 2 0 0 2    # Outcome Date GA Lbr Len/2nd Weight Sex Delivery Anes PTL Lv  4 Term 10/28/15 [redacted]w[redacted]d  7 lb 4.9 oz (3.315 kg) F CS-LTranv Spinal N LIV  3 Term 11/12/10 [redacted]w[redacted]d  8 lb 6 oz (3.799 kg) M Vag-Spont EPI N LIV  2 TAB           1 TAB              The following portions of the patient's history were reviewed and updated as appropriate: allergies, current medications, past family history, past medical history, past social history, past surgical history and problem list.  Review of Systems Pertinent items noted in HPI and remainder of comprehensive ROS otherwise negative.    Objective:      Vitals:   05/11/19 1346  BP: 120/77  Pulse: 67  Weight: 250 lb (113.4 kg)  Height: 5\' 5"  (1.651 m)   Vitals:  WNL General appearance: alert, cooperative and no distress  HEENT: Normocephalic, without obvious abnormality, atraumatic Eyes: negative Throat: lips, mucosa, and tongue normal; teeth and gums normal  Respiratory: Clear to auscultation bilaterally  CV: Regular rate and rhythm  Breasts:  Normal appearance, no masses or tenderness, no nipple retraction or dimpling  GI: Soft, non-tender; bowel sounds normal; no masses,  no organomegaly  GU: External Genitalia:  Tanner V, no lesion Urethra:  No prolapse   Vagina: Pink, normal rugae, no blood or discharge  Cervix: No CMT, no lesion  Uterus:  Normal size and contour, non tender  Adnexa: Normal, no masses, tenderness in left adnexa  Musculoskeletal: No edema,  redness or tenderness in the calves or thighs  Skin: No lesions or rash  Lymphatic: Axillary adenopathy: none     Psychiatric: Normal mood and behavior        Assessment:    Healthy female exam.    Plan:   Pap with reflex testing Loestrin FE 24 Pt declines STD testing TVUS to evaluate LLQ pain CBC and TSH to evaluate menorrhagia

## 2019-05-11 NOTE — Progress Notes (Signed)
Pt wants to discuss birth control

## 2019-05-11 NOTE — Addendum Note (Signed)
Addended by: Lyndal Rainbow on: 05/11/2019 02:44 PM   Modules accepted: Orders

## 2019-05-12 LAB — CBC
HCT: 37.5 % (ref 35.0–45.0)
Hemoglobin: 12.3 g/dL (ref 11.7–15.5)
MCH: 28.1 pg (ref 27.0–33.0)
MCHC: 32.8 g/dL (ref 32.0–36.0)
MCV: 85.6 fL (ref 80.0–100.0)
MPV: 12.1 fL (ref 7.5–12.5)
Platelets: 258 10*3/uL (ref 140–400)
RBC: 4.38 10*6/uL (ref 3.80–5.10)
RDW: 13.2 % (ref 11.0–15.0)
WBC: 9.4 10*3/uL (ref 3.8–10.8)

## 2019-05-12 LAB — TSH: TSH: 0.84 mIU/L

## 2019-05-13 LAB — CYTOLOGY - PAP: Diagnosis: NEGATIVE

## 2019-05-18 ENCOUNTER — Other Ambulatory Visit: Payer: Medicaid Other

## 2019-05-25 ENCOUNTER — Ambulatory Visit (HOSPITAL_COMMUNITY): Payer: Medicaid Other | Attending: Obstetrics & Gynecology

## 2019-07-21 ENCOUNTER — Other Ambulatory Visit: Payer: Self-pay

## 2019-07-21 ENCOUNTER — Other Ambulatory Visit (HOSPITAL_COMMUNITY)
Admission: RE | Admit: 2019-07-21 | Discharge: 2019-07-21 | Disposition: A | Discharge status: Home / Self Care | Payer: BC Managed Care – PPO | Source: Ambulatory Visit | Attending: Advanced Practice Midwife | Admitting: Advanced Practice Midwife

## 2019-07-21 ENCOUNTER — Encounter: Payer: Self-pay | Admitting: Advanced Practice Midwife

## 2019-07-21 ENCOUNTER — Ambulatory Visit (INDEPENDENT_AMBULATORY_CARE_PROVIDER_SITE_OTHER): Payer: BC Managed Care – PPO | Admitting: Advanced Practice Midwife

## 2019-07-21 VITALS — BP 114/74 | HR 86 | Temp 97.4°F | Resp 16 | Ht 66.0 in | Wt 254.0 lb

## 2019-07-21 DIAGNOSIS — Z3202 Encounter for pregnancy test, result negative: Secondary | ICD-10-CM

## 2019-07-21 DIAGNOSIS — Z3009 Encounter for other general counseling and advice on contraception: Secondary | ICD-10-CM | POA: Diagnosis not present

## 2019-07-21 DIAGNOSIS — R102 Pelvic and perineal pain: Secondary | ICD-10-CM | POA: Diagnosis not present

## 2019-07-21 DIAGNOSIS — N926 Irregular menstruation, unspecified: Secondary | ICD-10-CM

## 2019-07-21 LAB — POCT URINE PREGNANCY: Preg Test, Ur: NEGATIVE

## 2019-07-21 NOTE — Patient Instructions (Signed)

## 2019-07-21 NOTE — Progress Notes (Addendum)
  GYNECOLOGY PROGRESS NOTE  History:  29 y.o. [id number] presents to [facility] office today for problem gyn visit. She reports onset of pelvic pain 2 weeks ago and no menses at the end of her birth control pack this month. She started pills last month and this is the first pack.  She also reports some breast tenderness similar to early pregnancy symptoms in the past.  She denies h/a, dizziness, shortness of breath, n/v, or fever/chills.    The following portions of the patient's history were reviewed and updated as appropriate: allergies, current medications, past family history, past medical history, past social history, past surgical history and problem list. Last pap smear on 05/11/19 was normal.  Review of Systems:  Pertinent items are noted in HPI.   Objective:  Physical Exam Blood pressure 114/74, pulse 86, temperature (!) 97.4 F (36.3 C), resp. rate 16, height 5\' 6"  (1.676 m), weight 254 lb (115.2 kg), last menstrual period 06/16/2019, unknown if currently breastfeeding. VS reviewed, nursing note reviewed,  Constitutional: well developed, well nourished, no distress HEENT: normocephalic CV: normal rate Pulm/chest wall: normal effort Breast Exam: deferred Abdomen: soft Neuro: alert and oriented x 3 Skin: warm, dry Psych: affect normal Pelvic exam: SSE Deferred Bimanual exam: Cervix 0/long/high, firm, anterior, neg CMT, uterus nontender, nonenlarged, adnexa without tenderness, enlargement, or mass  Assessment & Plan:  1. Menstrual period late --Started OCPs last month, no period at end of pack.   --Took abx x 1 week, did not know they could affect OCP effectiveness --UPT negative today   - POCT urine pregnancy - B-HCG Quant - CBC --Urine culture  2. Pelvic pain --Cramping bilateral lower abdominal pain x 2 weeks --Acute vs chronic pain as pt reported pain at her visit 05/11/19 --Bimanual exam wnl ---Pt with report of occasional vaginal odor, not new --Swabs pending,  will sent for pelvic 13/2/20 as ordered at previous visit - B-HCG Quant - CBC - Cervicovaginal ancillary only( [place]) - US Pelvis Complete; Future  3.  Contraceptive counseling --Pt had IUD in the past and liked it but got pregnant with IUD. --IUD is still more effective that OCPs, discussed LARCs as most effective forms of contraception.  Pt to consider but will remain on OCPs for now to see if symptoms improve. --F/U in 3 months  [place], CNM 11:02 AM

## 2019-07-22 LAB — CBC
HCT: 39 % (ref 35.0–45.0)
Hemoglobin: 12.9 g/dL (ref 11.7–15.5)
MCH: 28 pg (ref 27.0–33.0)
MCHC: 33.1 g/dL (ref 32.0–36.0)
MCV: 84.6 fL (ref 80.0–100.0)
MPV: 12.2 fL (ref 7.5–12.5)
Platelets: 212 10*3/uL (ref 140–400)
RBC: 4.61 10*6/uL (ref 3.80–5.10)
RDW: 13.2 % (ref 11.0–15.0)
WBC: 8.2 10*3/uL (ref 3.8–10.8)

## 2019-07-22 LAB — HCG, QUANTITATIVE, PREGNANCY: HCG, Total, QN: <3 m[IU]/mL

## 2019-07-23 LAB — CERVICOVAGINAL ANCILLARY ONLY
Bacterial Vaginitis (gardnerella): POSITIVE — AB
Candida Glabrata: NEGATIVE
Candida Vaginitis: NEGATIVE
Chlamydia: NEGATIVE
Comment: NEGATIVE
Comment: NEGATIVE
Comment: NEGATIVE
Comment: NEGATIVE
Comment: NEGATIVE
Comment: NORMAL
Neisseria Gonorrhea: NEGATIVE
Trichomonas: NEGATIVE

## 2019-07-27 ENCOUNTER — Other Ambulatory Visit: Payer: BC Managed Care – PPO

## 2020-03-29 ENCOUNTER — Encounter: Payer: BC Managed Care – PPO | Admitting: Certified Nurse Midwife

## 2020-03-29 ENCOUNTER — Encounter: Payer: Self-pay | Admitting: Certified Nurse Midwife

## 2020-04-02 NOTE — Progress Notes (Signed)
This encounter was created in error - please disregard.

## 2020-07-12 ENCOUNTER — Other Ambulatory Visit: Payer: Self-pay

## 2020-07-12 ENCOUNTER — Other Ambulatory Visit (HOSPITAL_COMMUNITY)
Admission: RE | Admit: 2020-07-12 | Discharge: 2020-07-12 | Disposition: A | Discharge status: Home / Self Care | Payer: BC Managed Care – PPO | Source: Ambulatory Visit | Attending: Advanced Practice Midwife | Admitting: Advanced Practice Midwife

## 2020-07-12 ENCOUNTER — Encounter: Payer: Self-pay | Admitting: Advanced Practice Midwife

## 2020-07-12 ENCOUNTER — Ambulatory Visit (INDEPENDENT_AMBULATORY_CARE_PROVIDER_SITE_OTHER): Payer: BC Managed Care – PPO | Admitting: Advanced Practice Midwife

## 2020-07-12 VITALS — BP 121/78 | HR 88 | Ht 66.0 in | Wt 263.0 lb

## 2020-07-12 DIAGNOSIS — N898 Other specified noninflammatory disorders of vagina: Secondary | ICD-10-CM | POA: Insufficient documentation

## 2020-07-12 DIAGNOSIS — Z01419 Encounter for gynecological examination (general) (routine) without abnormal findings: Secondary | ICD-10-CM

## 2020-07-12 DIAGNOSIS — N979 Female infertility, unspecified: Secondary | ICD-10-CM | POA: Diagnosis not present

## 2020-07-12 DIAGNOSIS — N926 Irregular menstruation, unspecified: Secondary | ICD-10-CM | POA: Diagnosis not present

## 2020-07-12 NOTE — Patient Instructions (Signed)
Some natural remedies/prevention to try for bacterial vaginosis/yeast infections: --Take a probiotic tablet/capsule every day for at least 1-2 months.   --Whenever you have symptoms, use boric acid suppositories vaginally every night for a week.   --Do not use scented soaps/perfumes in the vaginal area, and do not overwash multiple times daily. --Wear breathable cotton underwear and do not wear tight restrictive clothing. --Limit pantyliner use, change your underwear several times daily instead. --Use condoms during intercourse.

## 2020-07-12 NOTE — Progress Notes (Addendum)
Subjective:     Molly Bowers is a 30 y.o. female here at [facility] for a routine exam.  Current complaints: recent yeast infection and continued vaginal irritation.  She took Cipro for laceration of her foot and possible infection and developed a vaginal yeast infection treated with Monistat and Diflucan from her PCP.  The yeast improved but she has continued irritation of the vaginal area. There is thin watery discharge without odor. She denies any STD risks.  She also reports interest in conceiving but has been trying more than 1 year without conception.  She reports regular periods until August when they became irregular, skipping a month sometimes. She has gained 25 lbs and lost 10 + lbs on and off this year. She was on track with exercise and weight loss but due to pain in her foot has slowed down and started to gain some weight back.  Personal health questionnaire reviewed: yes.  Do you have a primary care provider? yes Do you feel safe at home? yes    Risk factors for chronic health problems: Smoking: Vaping Alchohol/how much: Occasional Pt BMI: Body mass index is 42.45 kg/m.   Gynecologic History Patient's last menstrual period was 06/14/2020. Contraception: none Last Pap: 2020. Results were: normal Last mammogram: n/a.   Obstetric History OB History  Gravida Para Term Preterm AB Living  '6 2 2 ' 0 2 2  SAB IAB Ectopic Multiple Live Births  0 2 0 0 2    # Outcome Date GA Lbr Len/2nd Weight Sex Delivery Anes PTL Lv  6 Gravida           5 Term 10/28/15 [redacted]w[redacted]d 7 lb 4.9 oz (3.315 kg) F CS-LTranv Spinal N LIV  4 Term 11/12/10 410w0d8 lb 6 oz (3.799 kg) M Vag-Spont EPI N LIV  3 Gravida           2 IAB           1 IAB              The following portions of the patient's history were reviewed and updated as appropriate: allergies, current medications, past family history, past medical history, past social history, past surgical history and problem list.  Review of  Systems Pertinent items noted in HPI and remainder of comprehensive ROS otherwise negative.    Objective:   BP 121/78   Pulse 88   Ht '5\' 6"'  (1.676 m)   Wt 263 lb (119.3 kg)   LMP 06/14/2020   Breastfeeding Unknown   BMI 42.45 kg/m  VS reviewed, nursing note reviewed,  Constitutional: well developed, well nourished, no distress HEENT: normocephalic CV: normal rate Pulm/chest wall: normal effort Breast Exam:  Deferred with low risks and shared decision making, discussed recommendation to start mammogram between 40-50 yo/ exam Abdomen: soft Neuro: alert and oriented x 3 Skin: warm, dry Psych: affect normal Pelvic exam: Performed: Cervix pink, visually closed, without lesion, scant white creamy discharge, vaginal walls and external genitalia normal Bimanual exam: Cervix 0/long/high, firm, anterior, neg CMT, uterus nontender, nonenlarged, adnexa without tenderness, enlargement, or mass       Assessment/Plan:   1. Vaginal itching --Exam wnl - Cervicovaginal ancillary only( [place]) - Cytology - PAP( [place])  2. Well woman exam --Doing well overall. Pap is not due but pt needs pelvic due to symptoms so discussed and will do Pap this year, then follow guidelines for Pap in 5 years if wnl with negative hpv. -  Cytology - PAP( )  3. Infertility, female --Pt to increase exercise and try healthy lifestyle changes with diet and quit smoking/vaping.  Exercise and diet, and especially weight loss may make periods more regular. --Try ovulation kit to look for [facility] surge --F/U in 3 months with MD in our office  4. Irregular menstrual cycle --May be related to weight gain/inactivity --Pt working on this, was on track but hurt her foot, will look for ways to exercise without causing more foot pain    Follow up in: 3 months or as needed.   Fatima Blank, CNM 1:11 PM

## 2020-07-13 ENCOUNTER — Other Ambulatory Visit: Payer: Self-pay | Admitting: Advanced Practice Midwife

## 2020-07-13 DIAGNOSIS — B9689 Other specified bacterial agents as the cause of diseases classified elsewhere: Secondary | ICD-10-CM

## 2020-07-13 LAB — CERVICOVAGINAL ANCILLARY ONLY
Bacterial Vaginitis (gardnerella): POSITIVE — AB
Candida Glabrata: NEGATIVE
Candida Vaginitis: NEGATIVE
Comment: NEGATIVE
Comment: NEGATIVE
Comment: NEGATIVE

## 2020-07-13 MED ORDER — METRONIDAZOLE 0.75 % VA GEL: 1.0000 | Freq: Every day | VAGINAL | 1 refills | Status: DC

## 2020-07-14 LAB — CYTOLOGY - PAP
Comment: NEGATIVE
Diagnosis: NEGATIVE
High risk HPV: NEGATIVE

## 2020-08-03 ENCOUNTER — Telehealth: Payer: Self-pay

## 2020-08-03 DIAGNOSIS — N760 Acute vaginitis: Secondary | ICD-10-CM

## 2020-08-03 DIAGNOSIS — B9689 Other specified bacterial agents as the cause of diseases classified elsewhere: Secondary | ICD-10-CM

## 2020-08-03 MED ORDER — METRONIDAZOLE 0.75 % VA GEL: 1.0000 | Freq: Every day | VAGINAL | 1 refills | Status: DC

## 2020-08-03 NOTE — Telephone Encounter (Signed)
Pt sent message that Metrogel was sent to wrong pharmacy. Old Rx discontinued and Rx sent to correct pharmacy.

## 2020-08-04 ENCOUNTER — Telehealth: Payer: Self-pay | Admitting: *Deleted

## 2020-08-04 NOTE — Telephone Encounter (Signed)
erroneous

## 2020-08-30 ENCOUNTER — Encounter: Payer: Self-pay | Admitting: Certified Nurse Midwife

## 2020-08-30 ENCOUNTER — Ambulatory Visit (INDEPENDENT_AMBULATORY_CARE_PROVIDER_SITE_OTHER): Payer: BC Managed Care – PPO | Admitting: Certified Nurse Midwife

## 2020-08-30 ENCOUNTER — Other Ambulatory Visit (HOSPITAL_COMMUNITY)
Admission: RE | Admit: 2020-08-30 | Discharge: 2020-08-30 | Disposition: A | Discharge status: Home / Self Care | Payer: BC Managed Care – PPO | Source: Ambulatory Visit | Attending: Certified Nurse Midwife | Admitting: Certified Nurse Midwife

## 2020-08-30 ENCOUNTER — Other Ambulatory Visit: Payer: Self-pay

## 2020-08-30 VITALS — BP 114/73 | HR 87 | Wt 264.0 lb

## 2020-08-30 DIAGNOSIS — O99210 Obesity complicating pregnancy, unspecified trimester: Secondary | ICD-10-CM | POA: Insufficient documentation

## 2020-08-30 DIAGNOSIS — O99211 Obesity complicating pregnancy, first trimester: Secondary | ICD-10-CM | POA: Diagnosis not present

## 2020-08-30 DIAGNOSIS — Z3480 Encounter for supervision of other normal pregnancy, unspecified trimester: Secondary | ICD-10-CM | POA: Diagnosis not present

## 2020-08-30 DIAGNOSIS — Z3A10 10 weeks gestation of pregnancy: Secondary | ICD-10-CM | POA: Diagnosis not present

## 2020-08-30 DIAGNOSIS — O219 Vomiting of pregnancy, unspecified: Secondary | ICD-10-CM | POA: Diagnosis not present

## 2020-08-30 DIAGNOSIS — O34219 Maternal care for unspecified type scar from previous cesarean delivery: Secondary | ICD-10-CM | POA: Diagnosis not present

## 2020-08-30 MED ORDER — ASPIRIN EC 81 MG PO TBEC: 81.0000 mg | DELAYED_RELEASE_TABLET | Freq: Every day | ORAL | 0 refills | Status: AC

## 2020-08-30 MED ORDER — ONDANSETRON 4 MG PO TBDP: 4.0000 mg | ORAL_TABLET | Freq: Three times a day (TID) | ORAL | 1 refills | Status: AC | PRN

## 2020-08-30 NOTE — Progress Notes (Signed)
Bedside U/S shows single IUP with FHT of 176 BPM and CRL is 31.5mm  GA 10w

## 2020-08-30 NOTE — Progress Notes (Signed)
History:   Molly Bowers is a 30 y.o. [id number] at 97w0dby early ultrasound being seen today for her first obstetrical visit.  Her obstetrical history is significant for hx of cesarean section, obesity. Patient does intend to breast feed. Pregnancy history fully reviewed.  Patient reports nausea.     HISTORY: OB History  Gravida Para Term Preterm AB Living  '7 2 2 ' 0 2 2  SAB IAB Ectopic Multiple Live Births  0 2 0 0 2    # Outcome Date GA Lbr Len/2nd Weight Sex Delivery Anes PTL Lv  7 Current           6 Term 10/28/15 445w1d7 lb 4.9 oz (3.315 kg) F CS-LTranv Spinal N LIV     Name: Dirosa,GIRL Gerline     Apgar1: 8  Apgar5: 9  5 Term 11/12/10 414w0d lb 6 oz (3.799 kg) M Vag-Spont EPI N LIV  4 Gravida           3 Gravida           2 IAB           1 IAB             Last pap smear was done 07/12/2020 and was normal  Past Medical History:  Diagnosis Date  . Anxiety   . Headache   . Ovarian cyst    Past Surgical History:  Procedure Laterality Date  . CESAREAN SECTION N/A 10/28/2015   Procedure: CESAREAN SECTION;  Surgeon: BerFrederico HammanD;  Location: [facility];  Service: Obstetrics;  Laterality: N/A;  . DILATION AND EVACUATION N/A 08/03/2016   Procedure: DILATATION AND EVACUATION (D&E) 2ND TRIMESTER;  Surgeon: UgoOsborne OmanD;  Location: [facility];  Service: Gynecology;  Laterality: N/A;  . KELOID EXCISION     Family History  Problem Relation Age of Onset  . Diabetes Mother   . Hypertension Mother   . Cancer Maternal Grandmother        brain tumor, died during surgery  . Cancer Paternal Grandfather        prostate   Social History   Tobacco Use  . Smoking status: Former Smoker    Types: Cigarettes    Quit date: 12/24/2014    Years since quitting: 5.6  . Smokeless tobacco: Never Used  Vaping Use  . Vaping Use: Never used  Substance Use Topics  . Alcohol use: No    Comment: occ  . Drug use: No   Allergies  Allergen Reactions  . Other Anaphylaxis     PEANUTS  . Peanut Oil Anaphylaxis and Shortness Of Breath  . Peanut-Containing Drug Products Anaphylaxis  . Penicillins Hives, Rash, Other (See Comments) and Shortness Of Breath    No reaction noted Has patient had a PCN reaction causing immediate rash, facial/tongue/throat swelling, SOB or lightheadedness with hypotension:NO Has patient had a PCN reaction causing severe rash involving mucus membranes or skin necrosis:NO Has patient had a PCN reaction that required hospitalization NO Has patient had a PCN reaction occurring within the last 10 years: NO If all of the above answers are "NO", then may proceed with Cephalosporin use.  No reaction noted Has patient had a PCN reaction causing immediate rash, facial/tongue/throat swelling, SOB or lightheadedness with hypotension:NO Has patient had a PCN reaction causing severe rash involving mucus membranes or skin necrosis:NO Has patient had a PCN reaction that required hospitalization NO Has patient had a PCN reaction occurring  within the last 10 years: NO If all of the above answers are "NO", then may proceed with Cephalosporin use. No reaction noted Has patient had a PCN reaction causing immediate rash, facial/tongue/throat swelling, SOB or lightheadedness with hypotension:NO Has patient had a PCN reaction causing severe rash involving mucus membranes or skin necrosis:NO Has patient had a PCN reaction that required hospitalization NO Has patient had a PCN reaction occurring within the last 10 years: NO If all of the above answers are "NO", then may proceed with Cephalosporin use.  . Vancomycin Itching and Rash  . Phenylephrine Hives   Current Outpatient Medications on File Prior to Visit  Medication Sig Dispense Refill  . Prenatal Vit-Fe Fumarate-FA (MULTIVITAMIN-PRENATAL) 27-0.8 MG TABS tablet Take 1 tablet by mouth daily at 12 noon.     No current facility-administered medications on file prior to visit.    Review of  Systems Pertinent items noted in HPI and remainder of comprehensive ROS otherwise negative.  Physical Exam:   Vitals:   08/30/20 1308  BP: 114/73  Pulse: 87  Weight: 264 lb (119.7 kg)   Fetal Heart Rate (bpm): 176  General: well-developed, well-nourished female in no acute distress  Skin: normal coloration and turgor, no rashes  Neurologic: oriented, normal, negative, normal mood  Extremities: normal strength, tone, and muscle mass, ROM of all joints is normal  HEENT PERRLA, extraocular movement intact and sclera clear  Neck supple and no masses  Cardiovascular: regular rate and rhythm  Respiratory:  no respiratory distress, normal breath sounds  Abdomen: soft, non-tender; bowel sounds normal; no masses,  no organomegaly    Assessment:    Pregnancy: [id number] Patient Active Problem List   Diagnosis Date Noted  . Supervision of other normal pregnancy, antepartum 08/30/2020  . History of cesarean section complicating pregnancy 62/94/7654  . Obesity affecting pregnancy, antepartum 08/30/2020  . Infertility, female 07/12/2020  . Irregular menstrual cycle 07/12/2020  . Dyspareunia in female 05/11/2019  . Pain in pelvis 05/11/2019  . Recurrent urinary tract infection 10/08/2017  . History of miscarriage 08/27/2016  . Other specified aftercare following surgery 09/24/2011     Plan:    1. Supervision of other normal pregnancy, antepartum - Welcomed back to practice  - Reviewed safety, visitor policy, reassurance about COVID-19 for pregnancy at this time. Discussed possible changes to visits, including televisits, that may occur due to COVID-19.  The office remains open if pt needs to be seen and MAU is open 24 hours/day for OB emergencies. - Anticipatory guidance on upcoming appointments  - Obstetric panel - Hepatitis C Antibody - HIV antibody (with reflex) - Culture, OB Urine - GC/Chlamydia probe amp ([place])not at [facility] - Hemoglobinopathy Evaluation - Babyscripts  Schedule Optimization  2. History of cesarean section complicating pregnancy - Patient reports C/S was due to "cord being wrapped around baby's neck", most likely fetal intolerance to labor - Educated and discussed with patient option of repeat C/S vs TOLAC - Discussed with patient risk of TOLAC and need to sign consent with MD, patient also plans BTL and can sign consent at that time as well   3. Obesity affecting pregnancy, antepartum - Comp Met (CMET) - Protein / creatinine ratio, urine - aspirin EC 81 MG tablet; Take 1 tablet (81 mg total) by mouth daily. Take after 12 weeks for prevention of preeclampsia later in pregnancy  Dispense: 300 tablet; Refill: 0  4. Nausea and vomiting during pregnancy - ondansetron (ZOFRAN ODT) 4 MG disintegrating tablet; Take  1 tablet (4 mg total) by mouth every 8 (eight) hours as needed for nausea or vomiting.  Dispense: 30 tablet; Refill: 1   Initial labs drawn. Continue prenatal vitamins. Problem list reviewed and updated. Genetic Screening discussed, NIPS: requested. Ultrasound discussed; fetal anatomic survey: requested. Anticipatory guidance about prenatal visits given including labs, ultrasounds, and testing. Discussed usage of Babyscripts and virtual visits as additional source of managing and completing prenatal visits in midst of coronavirus and pandemic.   Encouraged to complete MyChart Registration for her ability to review results, send requests, and have questions addressed.  The nature of [place] for [facility] with multiple MDs and Advanced Practice Providers was explained to patient; also emphasized that residents, students are part of our team. Routine obstetric precautions reviewed. Encouraged to seek out care at office or emergency room [facility] MAU preferred) for urgent and/or emergent concerns. Return in about 4 weeks (around 09/27/2020) for LROB, in person.     Lajean Manes, [place] for  [employer], [place]

## 2020-08-30 NOTE — Patient Instructions (Signed)
Safe Medications in Pregnancy   Acne: Benzoyl Peroxide Salicylic Acid  Backache/Headache: Tylenol: 2 regular strength every 4 hours OR              2 Extra strength every 6 hours  Colds/Coughs/Allergies: Benadryl (alcohol free) 25 mg every 6 hours as needed Breath right strips Claritin Cepacol throat lozenges Chloraseptic throat spray Cold-Eeze- up to three times per day Cough drops, alcohol free Flonase (by prescription only) Guaifenesin Mucinex Robitussin DM (plain only, alcohol free) Saline nasal spray/drops Sudafed (pseudoephedrine) & Actifed ** use only after [redacted] weeks gestation and if you do not have high blood pressure Tylenol Vicks Vaporub Zinc lozenges Zyrtec   Constipation: Colace Ducolax suppositories Fleet enema Glycerin suppositories Metamucil Milk of magnesia Miralax Senokot Smooth move tea  Diarrhea: Kaopectate Imodium A-D  *NO pepto Bismol  Hemorrhoids: Anusol Anusol HC Preparation H Tucks  Indigestion: Tums Maalox Mylanta Zantac  Pepcid  Insomnia: Benadryl (alcohol free) 25mg every 6 hours as needed Tylenol PM Unisom, no Gelcaps  Leg Cramps: Tums MagGel  Nausea/Vomiting:  Bonine Dramamine Emetrol Ginger extract Sea bands Meclizine  Nausea medication to take during pregnancy:  Unisom (doxylamine succinate 25 mg tablets) Take one tablet daily at bedtime. If symptoms are not adequately controlled, the dose can be increased to a maximum recommended dose of two tablets daily (1/2 tablet in the morning, 1/2 tablet mid-afternoon and one at bedtime). Vitamin B6 100mg tablets. Take one tablet twice a day (up to 200 mg per day).  Skin Rashes: Aveeno products Benadryl cream or 25mg every 6 hours as needed Calamine Lotion 1% cortisone cream  Yeast infection: Gyne-lotrimin 7 Monistat 7   **If taking multiple medications, please check labels to avoid duplicating the same active ingredients **take medication as directed on  the label ** Do not exceed 4000 mg of tylenol in 24 hours **Do not take medications that contain aspirin or ibuprofen     https://www.cdc.gov/pregnancy/infections.html">  First Trimester of Pregnancy  The first trimester of pregnancy starts on the first day of your last menstrual period until the end of week 12. This is also called months 1 through 3 of pregnancy. Body changes during your first trimester Your body goes through many changes during pregnancy. The changes usually return to normal after your baby is born. Physical changes  You may gain or lose weight.  Your breasts may grow larger and hurt. The area around your nipples may get darker.  Dark spots or blotches may develop on your face.  You may have changes in your hair. Health changes  You may feel like you might vomit (nauseous), and you may vomit.  You may have heartburn.  You may have headaches.  You may have trouble pooping (constipation).  Your gums may bleed. Other changes  You may get tired easily.  You may pee (urinate) more often.  Your menstrual periods will stop.  You may not feel hungry.  You may want to eat certain kinds of food.  You may have changes in your emotions from day to day.  You may have more dreams. Follow these instructions at home: Medicines  Take over-the-counter and prescription medicines only as told by your doctor. Some medicines are not safe during pregnancy.  Take a prenatal vitamin that contains at least 600 micrograms (mcg) of folic acid. Eating and drinking  Eat healthy meals that include: ? Fresh fruits and vegetables. ? Whole grains. ? Good sources of protein, such as meat, eggs, or tofu. ? Low-fat   dairy products.  Avoid raw meat and unpasteurized juice, milk, and cheese.  If you feel like you may vomit, or you vomit: ? Eat 4 or 5 small meals a day instead of 3 large meals. ? Try eating a few soda crackers. ? Drink liquids between meals instead of  during meals.  You may need to take these actions to prevent or treat trouble pooping: ? Drink enough fluids to keep your pee (urine) pale yellow. ? Eat foods that are high in fiber. These include beans, whole grains, and fresh fruits and vegetables. ? Limit foods that are high in fat and sugar. These include fried or sweet foods. Activity  Exercise only as told by your doctor. Most people can do their usual exercise routine during pregnancy.  Stop exercising if you have cramps or pain in your lower belly (abdomen) or low back.  Do not exercise if it is too hot or too humid, or if you are in a place of great height (high altitude).  Avoid heavy lifting.  If you choose to, you may have sex unless your doctor tells you not to. Relieving pain and discomfort  Wear a good support bra if your breasts are sore.  Rest with your legs raised (elevated) if you have leg cramps or low back pain.  If you have bulging veins (varicose veins) in your legs: ? Wear support hose as told by your doctor. ? Raise your feet for 15 minutes, 3-4 times a day. ? Limit salt in your food. Safety  Wear your seat belt at all times when you are in a car.  Talk with your doctor if someone is hurting you or yelling at you.  Talk with your doctor if you are feeling sad or have thoughts of hurting yourself. Lifestyle  Do not use hot tubs, steam rooms, or saunas.  Do not douche. Do not use tampons or scented sanitary pads.  Do not use herbal medicines, illegal drugs, or medicines that are not approved by your doctor. Do not drink alcohol.  Do not smoke or use any products that contain nicotine or tobacco. If you need help quitting, ask your doctor.  Avoid cat litter boxes and soil that is used by cats. These carry germs that can cause harm to the baby and can cause a loss of your baby by miscarriage or stillbirth. General instructions  Keep all follow-up visits. This is important.  Ask for help if you  need counseling or if you need help with nutrition. Your doctor can give you advice or tell you where to go for help.  Visit your dentist. At home, brush your teeth with a soft toothbrush. Floss gently.  Write down your questions. Take them to your prenatal visits. Where to find more information  American Pregnancy Association: americanpregnancy.org  American College of Obstetricians and Gynecologists: www.acog.org  Office on Women's Health: womenshealth.gov/pregnancy Contact a doctor if:  You are dizzy.  You have a fever.  You have mild cramps or pressure in your lower belly.  You have a nagging pain in your belly area.  You continue to feel like you may vomit, you vomit, or you have watery poop (diarrhea) for 24 hours or longer.  You have a bad-smelling fluid coming from your vagina.  You have pain when you pee.  You are exposed to a disease that spreads from person to person, such as chickenpox, measles, Zika virus, HIV, or hepatitis. Get help right away if:  You have spotting or   bleeding from your vagina.  You have very bad belly cramping or pain.  You have shortness of breath or chest pain.  You have any kind of injury, such as from a fall or a car crash.  You have new or increased pain, swelling, or redness in an arm or leg. Summary  The first trimester of pregnancy starts on the first day of your last menstrual period until the end of week 12 (months 1 through 3).  Eat 4 or 5 small meals a day instead of 3 large meals.  Do not smoke or use any products that contain nicotine or tobacco. If you need help quitting, ask your doctor.  Keep all follow-up visits. This information is not intended to replace advice given to you by your health care provider. Make sure you discuss any questions you have with your health care provider. Document Revised: 12/02/2019 Document Reviewed: 10/08/2019 Elsevier Patient Education  2021 Elsevier Inc.  

## 2020-08-31 LAB — COMPREHENSIVE METABOLIC PANEL
AG Ratio: 1.5 (calc) (ref 1.0–2.5)
ALT: 10 U/L (ref 6–29)
AST: 14 U/L (ref 10–30)
Albumin: 3.9 g/dL (ref 3.6–5.1)
Alkaline phosphatase (APISO): 53 U/L (ref 31–125)
BUN: 7 mg/dL (ref 7–25)
CO2: 22 mmol/L (ref 20–32)
Calcium: 9.1 mg/dL (ref 8.6–10.2)
Chloride: 105 mmol/L (ref 98–110)
Creat: 0.58 mg/dL (ref 0.50–1.10)
Globulin: 2.6 g/dL (calc) (ref 1.9–3.7)
Glucose, Bld: 69 mg/dL (ref 65–99)
Potassium: 3.8 mmol/L (ref 3.5–5.3)
Sodium: 137 mmol/L (ref 135–146)
Total Bilirubin: 0.5 mg/dL (ref 0.2–1.2)
Total Protein: 6.5 g/dL (ref 6.1–8.1)

## 2020-08-31 LAB — GC/CHLAMYDIA PROBE AMP (~~LOC~~) NOT AT ARMC
Chlamydia: NEGATIVE
Comment: NEGATIVE
Comment: NORMAL
Neisseria Gonorrhea: NEGATIVE

## 2020-08-31 LAB — TIQ- MISLABELED: Test Ordered On Req: 1715

## 2020-09-01 LAB — OBSTETRIC PANEL
Absolute Monocytes: 678 cells/uL (ref 200–950)
Antibody Screen: NOT DETECTED
Basophils Absolute: 24 cells/uL (ref 0–200)
Basophils Relative: 0.2 %
Eosinophils Absolute: 36 cells/uL (ref 15–500)
Eosinophils Relative: 0.3 %
HCT: 39.7 % (ref 35.0–45.0)
Hemoglobin: 13.3 g/dL (ref 11.7–15.5)
Hepatitis B Surface Ag: NONREACTIVE
Lymphs Abs: 1940 cells/uL (ref 850–3900)
MCH: 29 pg (ref 27.0–33.0)
MCHC: 33.5 g/dL (ref 32.0–36.0)
MCV: 86.7 fL (ref 80.0–100.0)
MPV: 12 fL (ref 7.5–12.5)
Monocytes Relative: 5.7 %
Neutro Abs: 9223 cells/uL — ABNORMAL HIGH (ref 1500–7800)
Neutrophils Relative %: 77.5 %
Platelets: 254 10*3/uL (ref 140–400)
RBC: 4.58 10*6/uL (ref 3.80–5.10)
RDW: 13 % (ref 11.0–15.0)
RPR Ser Ql: NONREACTIVE
Rubella: <0.9 Index — ABNORMAL LOW
Total Lymphocyte: 16.3 %
WBC: 11.9 10*3/uL — ABNORMAL HIGH (ref 3.8–10.8)

## 2020-09-01 LAB — HEPATITIS C ANTIBODY
Hepatitis C Ab: NONREACTIVE
SIGNAL TO CUT-OFF: 0.01 (ref <1.00)

## 2020-09-01 LAB — HEMOGLOBINOPATHY EVALUATION
Fetal Hemoglobin Testing: <1 % (ref 0.0–1.9)
HCT: 38.9 % (ref 35.0–45.0)
Hemoglobin A2 - HGBRFX: 2.6 % (ref 2.2–3.2)
Hemoglobin: 13 g/dL (ref 11.7–15.5)
Hgb A: 97.4 % (ref >96.0)
MCH: 29.1 pg (ref 27.0–33.0)
MCV: 87 fL (ref 80.0–100.0)
RBC: 4.47 10*6/uL (ref 3.80–5.10)
RDW: 13.1 % (ref 11.0–15.0)

## 2020-09-01 LAB — HIV ANTIBODY (ROUTINE TESTING W REFLEX): HIV 1&2 Ab, 4th Generation: NONREACTIVE

## 2020-09-01 LAB — URINE CULTURE, OB REFLEX

## 2020-09-01 LAB — CULTURE, OB URINE

## 2020-09-13 ENCOUNTER — Ambulatory Visit: Payer: BC Managed Care – PPO

## 2020-09-22 ENCOUNTER — Telehealth: Payer: Self-pay | Admitting: *Deleted

## 2020-09-22 NOTE — Telephone Encounter (Signed)
Left patient a message to call the office or MyChart message if she no longer needs appointment on 09/27/2020 due to being seen at a [facility] office.

## 2020-09-27 ENCOUNTER — Encounter: Payer: BC Managed Care – PPO | Admitting: Advanced Practice Midwife
